# Patient Record
Sex: Female | Born: 1960 | Race: White | Hispanic: No | Marital: Married | Smoking: Never smoker
Health system: Southern US, Community
[De-identification: ages and names within clinical notes are randomized; demographics above are authoritative.]

## PROBLEM LIST (undated history)

## (undated) DIAGNOSIS — K219 Gastro-esophageal reflux disease without esophagitis: Secondary | ICD-10-CM

## (undated) DIAGNOSIS — Z973 Presence of spectacles and contact lenses: Secondary | ICD-10-CM

## (undated) DIAGNOSIS — Z8489 Family history of other specified conditions: Secondary | ICD-10-CM

## (undated) DIAGNOSIS — R9389 Abnormal findings on diagnostic imaging of other specified body structures: Secondary | ICD-10-CM

## (undated) DIAGNOSIS — N882 Stricture and stenosis of cervix uteri: Secondary | ICD-10-CM

## (undated) DIAGNOSIS — N950 Postmenopausal bleeding: Secondary | ICD-10-CM

## (undated) DIAGNOSIS — N393 Stress incontinence (female) (male): Secondary | ICD-10-CM

---

## 1993-09-26 HISTORY — PX: OTHER SURGICAL HISTORY: SHX169

## 1998-09-29 ENCOUNTER — Other Ambulatory Visit: Admission: RE | Admit: 1998-09-29 | Discharge: 1998-09-29 | Payer: Self-pay | Admitting: *Deleted

## 2000-05-26 ENCOUNTER — Other Ambulatory Visit: Admission: RE | Admit: 2000-05-26 | Discharge: 2000-05-26 | Payer: Self-pay | Admitting: *Deleted

## 2001-10-08 ENCOUNTER — Other Ambulatory Visit: Admission: RE | Admit: 2001-10-08 | Discharge: 2001-10-08 | Payer: Self-pay | Admitting: *Deleted

## 2003-05-22 ENCOUNTER — Other Ambulatory Visit: Admission: RE | Admit: 2003-05-22 | Discharge: 2003-05-22 | Payer: Self-pay | Admitting: *Deleted

## 2005-07-11 ENCOUNTER — Other Ambulatory Visit: Admission: RE | Admit: 2005-07-11 | Discharge: 2005-07-11 | Payer: Self-pay | Admitting: Obstetrics and Gynecology

## 2011-09-09 ENCOUNTER — Ambulatory Visit: Payer: BC Managed Care – PPO

## 2011-09-09 ENCOUNTER — Ambulatory Visit (INDEPENDENT_AMBULATORY_CARE_PROVIDER_SITE_OTHER): Payer: BC Managed Care – PPO

## 2011-09-09 DIAGNOSIS — Z23 Encounter for immunization: Secondary | ICD-10-CM

## 2012-08-27 ENCOUNTER — Ambulatory Visit: Payer: BC Managed Care – PPO | Admitting: Family Medicine

## 2012-08-27 VITALS — BP 114/74 | HR 75 | Temp 97.3°F | Resp 16

## 2012-08-27 DIAGNOSIS — Z23 Encounter for immunization: Secondary | ICD-10-CM

## 2013-08-15 ENCOUNTER — Ambulatory Visit (INDEPENDENT_AMBULATORY_CARE_PROVIDER_SITE_OTHER): Payer: BC Managed Care – PPO | Admitting: Physician Assistant

## 2013-08-15 VITALS — BP 120/80 | HR 103 | Temp 102.1°F | Resp 18 | Ht 67.0 in | Wt 201.0 lb

## 2013-08-15 DIAGNOSIS — R6889 Other general symptoms and signs: Secondary | ICD-10-CM

## 2013-08-15 LAB — POCT INFLUENZA A/B
Influenza A, POC: NEGATIVE
Influenza B, POC: NEGATIVE

## 2013-08-15 LAB — POCT CBC
Granulocyte percent: 87.1 %G — AB (ref 37–80)
Hemoglobin: 13.9 g/dL (ref 12.2–16.2)
MCH, POC: 27.6 pg (ref 27–31.2)
MCV: 87.1 fL (ref 80–97)
MID (cbc): 0.4 (ref 0–0.9)
Platelet Count, POC: 151 10*3/uL (ref 142–424)
RBC: 5.04 M/uL (ref 4.04–5.48)
WBC: 10.6 10*3/uL — AB (ref 4.6–10.2)

## 2013-08-15 MED ORDER — BENZONATATE 100 MG PO CAPS: 100.0000 mg | ORAL_CAPSULE | Freq: Three times a day (TID) | ORAL | Status: DC | PRN

## 2013-08-15 MED ORDER — HYDROCODONE-HOMATROPINE 5-1.5 MG/5ML PO SYRP: 5.0000 mL | ORAL_SOLUTION | Freq: Three times a day (TID) | ORAL | Status: DC | PRN

## 2013-08-15 MED ORDER — AMOXICILLIN 875 MG PO TABS: 875.0000 mg | ORAL_TABLET | Freq: Two times a day (BID) | ORAL | Status: DC

## 2013-08-15 NOTE — Patient Instructions (Signed)
Begin taking the amoxicillin as directed.  Be sure to finish the full course even when you begin feeling better.    Alternate Tylenol and Advil every 4 hours to keep the fever down.  (Next dose ibuprofen 800mg  at about 2 pm, then take 1000mg  Tylenol at about 6 pm, etc)  Use the benzonatate (Tessalon) every 8 hours as needed for cough.  You can also continue Delsym if that is helping.  Use the Hycodan syrup if needed - may make you sleepy, so be careful with the first dose.    You can continue sudafed, allergy medicine, etc  Drink plenty of fluids (water is best!) and get plenty of rest.  If any symptoms are worsening or not improving, please let us know.

## 2013-08-15 NOTE — Progress Notes (Signed)
[address]  [place], [place]  [phone]  [email or web address]  Subjective:    Patient ID: Lindsey Boyle, female    DOB: 1961-06-25, 52 y.o.   MRN: [id number]  HPI   Lindsey Boyle is a very pleasant 52 yr old female here with concern for illness.  Reports fever (tmax 102F), ear pain, sinus pressure, bad cough (non productive), SOB, body aches, fatigue.  All symptoms started 3-4 days ago.  Appetite has been down.  Feels dry.  No GI symptoms.  No flu shot this year - has not had time to get one.  Son was sick with pneumonia a couple weeks ago, saw Dr. Cleta Alberts, was sent to hospital.  Then pt's mother hospitalized with hyponatremia and UTI.  Has been with mother in the hospital all week.  Mother now at rehab.    No known sick contacts.  Has taken some Dayquil.  Has not really been treating fever   Review of Systems  Constitutional: Positive for fever, appetite change (decreased) and fatigue. Negative for chills.  HENT: Positive for congestion, ear pain and sinus pressure. Negative for sore throat.   Respiratory: Positive for cough. Negative for shortness of breath and wheezing.   Cardiovascular: Negative for chest pain.  Gastrointestinal: Negative for nausea, vomiting, abdominal pain and diarrhea.  Genitourinary: Negative for dysuria and hematuria.  Musculoskeletal: Positive for arthralgias, back pain and myalgias.  Skin: Negative for rash.  Neurological: Negative for headaches.  Hematological: Negative.   Psychiatric/Behavioral: Negative.        Objective:   Physical Exam  Vitals reviewed. Constitutional: She is oriented to person, place, and time. She appears well-developed and well-nourished. No distress.  HENT:  Head: Normocephalic and atraumatic.  Right Ear: Tympanic membrane and ear canal normal.  Left Ear: Tympanic membrane and ear canal normal.  Nose: Mucosal edema present. Right sinus exhibits maxillary sinus tenderness and frontal sinus tenderness. Left sinus exhibits maxillary  sinus tenderness and frontal sinus tenderness.  Mouth/Throat: Uvula is midline, oropharynx is clear and moist and mucous membranes are normal.  Sinuses TTP but "everywhere hurts"  Eyes: Conjunctivae are normal. No scleral icterus.  Neck: Neck supple.  Cardiovascular: Normal rate, regular rhythm and normal heart sounds.   Pulmonary/Chest: Effort normal and breath sounds normal. She has no wheezes. She has no rales.  Abdominal: Soft. There is no tenderness.  Lymphadenopathy:    She has no cervical adenopathy.  Neurological: She is alert and oriented to person, place, and time.  Skin: Skin is warm and dry.  Psychiatric: She has a normal mood and affect. Her behavior is normal.     Results for orders placed in visit on 08/15/13  POCT INFLUENZA A/B      Result Value Range   Influenza A, POC Negative     Influenza B, POC Negative    POCT CBC      Result Value Range   WBC 10.6 (*) 4.6 - 10.2 K/uL   Lymph, poc 1.0  0.6 - 3.4   POC LYMPH PERCENT 9.1 (*) 10 - 50 %L   MID (cbc) 0.4  0 - 0.9   POC MID % 3.8  0 - 12 %M   POC Granulocyte 9.2 (*) 2 - 6.9   Granulocyte percent 87.1 (*) 37 - 80 %G   RBC 5.04  4.04 - 5.48 M/uL   Hemoglobin 13.9  12.2 - 16.2 g/dL   HCT, POC 01.0  27.2 - 47.9 %   MCV 87.1  80 -  97 fL   MCH, POC 27.6  27 - 31.2 pg   MCHC 31.7 (*) 31.8 - 35.4 g/dL   RDW, POC 16.1     Platelet Count, POC 151  142 - 424 K/uL   MPV 10.7  0 - 99.8 fL    1g APAP given in clinic for fever of 102.68F Recheck temp approx 30 mins later 103.59F    Assessment & Plan:  Flu-like symptoms - Plan: POCT Influenza A/B, POCT CBC, HYDROcodone-homatropine (HYCODAN) 5-1.5 MG/5ML syrup, benzonatate (TESSALON) 100 MG capsule   Lindsey Boyle is a very pleasant 52 yr old female here with 3-4 days of flu-like symptoms including fever, body aches, non-productive cough.  Clinically, this looks like influenza, but rapid flu is negative today.  CBC with slight leukocytosis and shift.  Will cover her with  amoxicillin for upper respiratory infection.  Tamiflu likely of no benefit at this point.  Will treat symptoms with Tessalon and Hycodan.  May continue sudafed and allergy medicine for symptom relief.  Push fluids, rest.  Work note provided.  Discussed RTC precautions including worsening fever, worsening cough, SOB, etc.  Pt understands and agrees with this plan.   Meds ordered this encounter  Medications  . HYDROcodone-homatropine (HYCODAN) 5-1.5 MG/5ML syrup    Sig: Take 5 mLs by mouth every 8 (eight) hours as needed for cough.    Dispense:  30 mL    Refill:  0    Order Specific Question:  Supervising Provider    Answer:  Ethelda Chick [[id number]]  . benzonatate (TESSALON) 100 MG capsule    Sig: Take 1-2 capsules (100-200 mg total) by mouth 3 (three) times daily as needed for cough.    Dispense:  40 capsule    Refill:  0    Order Specific Question:  Supervising Provider    Answer:  Ethelda Chick [[id number]]  . amoxicillin (AMOXIL) 875 MG tablet    Sig: Take 1 tablet (875 mg total) by mouth 2 (two) times daily.    Dispense:  20 tablet    Refill:  0    Order Specific Question:  Supervising Provider    Answer:  Ethelda Chick [[id number]]    Loleta Dicker MHS, PA-C [facility] 11/20/20143:14 PM

## 2013-08-18 ENCOUNTER — Ambulatory Visit: Payer: BC Managed Care – PPO

## 2013-08-18 ENCOUNTER — Ambulatory Visit (INDEPENDENT_AMBULATORY_CARE_PROVIDER_SITE_OTHER): Payer: BC Managed Care – PPO | Admitting: Emergency Medicine

## 2013-08-18 VITALS — BP 126/78 | HR 86 | Temp 98.9°F | Resp 17 | Ht 67.0 in | Wt 208.0 lb

## 2013-08-18 DIAGNOSIS — R059 Cough, unspecified: Secondary | ICD-10-CM

## 2013-08-18 DIAGNOSIS — R05 Cough: Secondary | ICD-10-CM

## 2013-08-18 DIAGNOSIS — J189 Pneumonia, unspecified organism: Secondary | ICD-10-CM

## 2013-08-18 MED ORDER — HYDROCOD POLST-CHLORPHEN POLST 10-8 MG/5ML PO LQCR: 5.0000 mL | Freq: Two times a day (BID) | ORAL | Status: DC | PRN

## 2013-08-18 MED ORDER — AZITHROMYCIN 250 MG PO TABS: ORAL_TABLET | ORAL | Status: DC

## 2013-08-18 MED ORDER — CEFTRIAXONE SODIUM 1 G IJ SOLR
1.0000 g | INTRAMUSCULAR | Status: DC
  Administered 2013-08-18: 1 g via INTRAMUSCULAR

## 2013-08-18 NOTE — Patient Instructions (Signed)

## 2013-08-18 NOTE — Progress Notes (Addendum)
[facility] [address] [place] [address] [phone]  Date:  08/18/2013   Name:  [name]   DOB:  [birth year]   MRN:  [id number]  PCP:  No primary provider on file.    Chief Complaint: Cough, Nasal Congestion, URI and Headache   History of Present Illness:  CORNISHA Boyle is a 52 y.o. very pleasant female patient who presents with the following:  Seen four days ago with URI symptoms. Negative flu test.  Negative CBC.   Ill all week with mucoid nasal drainage, sore throat and hoarseness.  Cough, malaise, and myalgias.  Continues to have fever since visit.  She was put on amoxicillin which has not altered her symptoms.   No wheezing or shortness of breath.  Poor appetite.  No nausea or vomiting or rash.  No improvement with over the counter medications or other home remedies. Denies other complaint or health concern today.   There are no active problems to display for this patient.   Past Medical History  Diagnosis Date  . Allergy     Past Surgical History  Procedure Laterality Date  . Cesarean section      History  Substance Use Topics  . Smoking status: Never Smoker   . Smokeless tobacco: Not on file  . Alcohol Use: Not on file    Family History  Problem Relation Age of Onset  . Cancer Mother   . Cancer Father     No Known Allergies  Medication list has been reviewed and updated.  Current Outpatient Prescriptions on File Prior to Visit  Medication Sig Dispense Refill  . amoxicillin (AMOXIL) 875 MG tablet Take 1 tablet (875 mg total) by mouth 2 (two) times daily.  20 tablet  0  . benzonatate (TESSALON) 100 MG capsule Take 1-2 capsules (100-200 mg total) by mouth 3 (three) times daily as needed for cough.  40 capsule  0  . HYDROcodone-homatropine (HYCODAN) 5-1.5 MG/5ML syrup Take 5 mLs by mouth every 8 (eight) hours as needed for cough.  30 mL  0   No current facility-administered medications on file prior to visit.    Review of  Systems:  As per HPI, otherwise negative.    Physical Examination: Filed Vitals:   08/18/13 0811  BP: 126/78  Pulse: 86  Temp: 98.9 F (37.2 C)  Resp: 17   Filed Vitals:   08/18/13 0811  Height: 5\' 7"  (1.702 m)  Weight: 208 lb (94.348 kg)   Body mass index is 32.57 kg/(m^2). Ideal Body Weight: Weight in (lb) to have BMI = 25: 159.3  GEN: WDWN, NAD, Non-toxic, A & O x 3 HEENT: Atraumatic, Normocephalic. Neck supple. No masses, No LAD. Ears and Nose: No external deformity. CV: RRR, No M/G/R. No JVD. No thrill. No extra heart sounds. PULM: CTA B, no wheezes, crackles, rhonchi. No retractions. No resp. distress. No accessory muscle use. ABD: S, NT, ND, +BS. No rebound. No HSM. EXTR: No c/c/e NEURO Normal gait.  PSYCH: Normally interactive. Conversant. Not depressed or anxious appearing.  Calm demeanor.    Assessment and Plan: Pneumonia zpak tussionex  Signed,  Phillips Odor, MD    [facility] reading (PRIMARY) by  Dr. Dareen Piano.  pneumonia.

## 2013-08-31 ENCOUNTER — Ambulatory Visit (INDEPENDENT_AMBULATORY_CARE_PROVIDER_SITE_OTHER): Payer: BC Managed Care – PPO | Admitting: Family Medicine

## 2013-08-31 ENCOUNTER — Ambulatory Visit: Payer: BC Managed Care – PPO

## 2013-08-31 VITALS — BP 110/68 | HR 82 | Temp 98.6°F | Resp 12 | Ht 66.8 in | Wt 206.0 lb

## 2013-08-31 DIAGNOSIS — J189 Pneumonia, unspecified organism: Secondary | ICD-10-CM

## 2013-08-31 DIAGNOSIS — Z23 Encounter for immunization: Secondary | ICD-10-CM

## 2013-08-31 NOTE — Progress Notes (Signed)
Subjective:  This chart was scribed for Norberto Sorenson, MD by Arlan Organ, ED Scribe. This patient was seen in room Room/bed 12 and the patient's care was started 8:50 AM.    Patient ID: Lindsey Boyle, female    DOB: 1961-08-28, 52 y.o.   MRN: [id number] Chief Complaint  Patient presents with  . Cough    improved - PNA x 2 wks      HPI HPI Comments: Lindsey Boyle is a 52 y.o. female who presents to [facility] seeking follow up today from visit on 08/18/13. Pt was initially seen by Virgilio Frees PA-C, for flu like symptoms with an associated fever, sinus pressure, and cough. At the time, pt reported her 71 yo son being ill w/ pna as well. Pt had a relatively normal CBC test and was started on amoxicillin for URI-type sxs. Pt followed with Dr. Dareen Piano 2 weeks ago in clinic, 3d later, as she didn't experience any improvement from her symptoms. A chest X-Ray was performed which showed a multi-focal pneumonitis of the right middle lobe and left upper lobe. Pt was then also started on a Zpak after being given a dose of rocephin in the clinic.   Pt now states her symptoms have mostly resolved. She is feeling dramatically better.  She reports her improvement started some time around the Thanksgiving holiday. She denies SOB, fever, or CP.  She is here for a repeat CXR to ensure the pna is gone and would like her flu shot.  Past Medical History  Diagnosis Date  . Allergy   . Pneumonia 08/19/2013    Current Outpatient Prescriptions on File Prior to Visit  Medication Sig Dispense Refill  . azithromycin (ZITHROMAX) 250 MG tablet Take 2 tabs PO x 1 dose, then 1 tab PO QD x 4 days  6 tablet  0  . benzonatate (TESSALON) 100 MG capsule Take 1-2 capsules (100-200 mg total) by mouth 3 (three) times daily as needed for cough.  40 capsule  0  . chlorpheniramine-HYDROcodone (TUSSIONEX PENNKINETIC ER) 10-8 MG/5ML LQCR Take 5 mLs by mouth every 12 (twelve) hours as needed.  60 mL  0  . HYDROcodone-homatropine (HYCODAN)  5-1.5 MG/5ML syrup Take 5 mLs by mouth every 8 (eight) hours as needed for cough.  30 mL  0   Current Facility-Administered Medications on File Prior to Visit  Medication Dose Route Frequency Provider Last Rate Last Dose  . cefTRIAXone (ROCEPHIN) injection 1 g  1 g Intramuscular Q24H Phillips Odor, MD   1 g at 08/18/13 1011    No Known Allergies   Review of Systems  Constitutional: Negative for fever, chills, diaphoresis and fatigue.  HENT: Negative for congestion, rhinorrhea and sinus pressure.   Respiratory: Positive for cough.   Cardiovascular: Negative for chest pain.  Musculoskeletal: Negative for myalgias.  Skin: Negative for rash.  Neurological: Negative for weakness.  Psychiatric/Behavioral: Negative for sleep disturbance.    BP 110/68  Pulse 82  Temp(Src) 98.6 F (37 C) (Oral)  Resp 12  Ht 5' 6.8" (1.697 m)  Wt 206 lb (93.441 kg)  BMI 32.45 kg/m2  SpO2 96%  LMP 08/14/2013   Objective:   Physical Exam  Nursing note and vitals reviewed. Constitutional: She is oriented to person, place, and time. She appears well-developed and well-nourished.  HENT:  Head: Normocephalic and atraumatic.  Eyes: EOM are normal.  Neck: Normal range of motion.  Cardiovascular: Normal rate, regular rhythm and normal heart sounds.   Pulmonary/Chest: Effort  normal and breath sounds normal.  Musculoskeletal: Normal range of motion.  Lymphadenopathy:    She has no cervical adenopathy.  Neurological: She is alert and oriented to person, place, and time.  Skin: Skin is warm and dry.  Psychiatric: She has a normal mood and affect. Her behavior is normal.   Primary X-Ray reading by Dr. Norberto Sorenson: Interval clearing of multi focal pneumonias   Assessment & Plan:  PNA (pneumonia) - Plan: DG Chest 2 View - resolved. Clearance documented  Need for prophylactic vaccination and inoculation against influenza - Plan: Flu Vaccine QUAD 36+ mos IM. Reviewed hand hygiene.  No orders of the defined  types were placed in this encounter.    I personally performed the services described in this documentation, which was scribed in my presence. The recorded information has been reviewed and considered, and addended by me as needed.  Norberto Sorenson, MD MPH

## 2014-02-23 ENCOUNTER — Ambulatory Visit (INDEPENDENT_AMBULATORY_CARE_PROVIDER_SITE_OTHER): Payer: BC Managed Care – PPO | Admitting: Physician Assistant

## 2014-02-23 DIAGNOSIS — Z23 Encounter for immunization: Secondary | ICD-10-CM

## 2014-02-23 NOTE — Patient Instructions (Signed)
Congratulations!        PRE-OPERATIVE INSTRUCTIONS        Please do the body wash as directed below  Do not smoke after midnight the night before surgery - this will increase your risk of nausea and vomiting  Do not eat or drink anything for 8 hours before surgery. Failure to do this may cause cancellation of your procedure. You may drink CLEAR beverages (that you can see through) and your Ensure up to 2 hours before the procedure.   Please DO take the following medications with a sip of water on the day of your surgery: prisolec, miralax       Please go get your blood drawn on Friday, 05/13/22.   [facility] (inside main [facility])  [address], [address]  [place] [address]  [phone]  Monday-Friday: 7:30 a.m. - 5 p.m.  Sunday: 7:30 - 10:30 a.m.   Closed on Saturdays   * Will open at 7 a.m. Sundays starting January 10, 2021    [facility]  [address]  [place] [address]  [phone]  Monday-Friday: 8 a.m. - 4:30 p.m.    [facility]  [address]  [place] [address]  [phone]  Monday-Friday: 7:30 a.m. - 4:30 p.m.   *Appointment required      Day of Surgery  On the day of your surgery please leave valuable items including cash and credit cards at home if you can. You can expect:  You will be asked to remove any of the following items:  Dentures and bridges  Hearing aids  Contact lenses  Nail polish from at least one finger nail  Wigs, hairpins, combs and barrettes  You will be asked to remove all your clothes and put on a special gown  Your nursing staff may start an intravenous line (IV) attached to a tube that will supply your body with fluids and medications during and after surgery.      Preparing for your surgery - Chlorhexidine Washes    Shower with Chlorhexidine (CHG) soap to prevent infection.    Instructions:  You should shower with CHG soap a minimum of three times before your surgery, or more often as directed by  your surgeon.  You can buy CHG soap over the counter at any drug store.    Showering several times before surgery blocks germ growth and provides the best protection when used at least 3 times in a row.    two nights before surgery  the night before surgery  the morning of the admission to surgery    How to shower with CHG soap:    1. Rinse your body with warm water.    5. Lather again before rinsing.   2. Wash your hair with regular shampoo.  Rinse your hair with water. If you are having  neck surgery, use CHG soap instead of your regular shampoo to wash your hair.  Rinse your hair with water.    6.      7.   Turn on the water and rinse CHG off your body.    Dry off with a clean towel.   3. Wet a clean sponge. Turn off the water. Apply CHG liberally.  8. Do not apply lotions or powders.       4. Firmly massage all areas: neck, arms, chest, back, abdomen, hips, groin, genitals  (external only) and   buttocks. Clean your legs and feet and between your fingers and  toes. Pay special attention to the site of your surgery and all surrounding skin. Ask for help to clean your back if you have a spinal surgery.  9. Use clean clothes and freshly laundered bed linens.     --- Repeat steps 1 - 9  each time you shower. ---     Caution: When using CHG soap, avoid contact with eyes, nose, ear canals and mouth.  Important reminders:    Do not use any other soaps or body wash when using CHG. Other soaps can block the CHG benefits.  After showering, do not apply lotion, cream, powder, deodorant, or hair conditioner.  Do not shave or remove body hair. Facial shaving is permitted. If you are having head surgery, ask your doctor whether you can shave.  CHG is safe to use on minor wounds, rashes, burns, and over staples and stiches.  Allergic reactions are rare but may occur. If you have an allergic reaction, stop using CHG and call your doctor if you have a skin irritation.  If you are allergic to CHG, please follow the bathing  instructions above using an over-the-counter regular soap instead of CHG.

## 2014-02-23 NOTE — Progress Notes (Signed)
Presents for Tdap vaccination.  First grandchild is due this summer.  1. Need for Tdap vaccination - Tdap vaccine greater than or equal to 52yo IM   Fernande Bras, PA-C Physician Assistant-Certified [facility]

## 2014-07-15 ENCOUNTER — Ambulatory Visit (INDEPENDENT_AMBULATORY_CARE_PROVIDER_SITE_OTHER): Payer: BC Managed Care – PPO

## 2014-07-15 DIAGNOSIS — Z23 Encounter for immunization: Secondary | ICD-10-CM

## 2014-08-28 LAB — HM MAMMOGRAPHY

## 2014-10-02 NOTE — H&P (Signed)
  Patient name  Isa RankinBlack, Cinthia DICTATION#  [id number] CSN# [id number]  Juluis MireMCCOMB,Edu On S, MD 10/02/2014 2:39 PM

## 2014-10-03 ENCOUNTER — Encounter (HOSPITAL_BASED_OUTPATIENT_CLINIC_OR_DEPARTMENT_OTHER): Payer: Self-pay | Admitting: *Deleted

## 2014-10-03 NOTE — Progress Notes (Signed)
NPO AFTER MN. ARRIVE AT 0600. NEEDS CBC. 

## 2014-10-03 NOTE — H&P (Signed)
NAMIsa Rankin:  Varon, Christalyn                 ACCOUNT NO.:  [id number]  MEDICAL RECORD NO.:  [id number]  LOCATION:                                FACILITY:  [facility]  PHYSICIAN:  Juluis MireJohn S. Raunak Antuna, M.D.   DATE OF BIRTH:  [birth year]  DATE OF ADMISSION: DATE OF DISCHARGE:                             HISTORY & PHYSICAL   DATE OF SURGERY:  [facility] [address].  HISTORY OF PRESENT ILLNESS:  The patient is a 54 year old, gravida 2, para 2, perimenopausal patient, has been having marked menstrual irregularities.  She came in for saline infusion ultrasound.  Did have a markedly thickened endometrium; however, we could not dilate the cervix even with the os binder to get a uterine specimen.  In view of this, she now presents for hysteroscopic evaluation along with dilation and curettage.  ALLERGIES:  Allergic to pollen and dust.  MEDICATIONS:  Including only supplement.  PAST SURGICAL HISTORY:  She had 3 cesarean sections.  No significant medical illnesses.  SOCIAL HISTORY:  No tobacco, and only occasional alcohol use.  FAMILY HISTORY:  Noncontributory.  REVIEW OF SYSTEMS:  Noncontributory.  PHYSICAL EXAMINATION:  VITAL SIGNS:  The patient is afebrile, stable vital signs. HEENT:  The patient is normocephalic.  Pupils equal, round, reactive to light and accommodation.  Extraocular movements are intact.  Sclerae and conjunctivae are clear.  Oropharynx clear. NECK:  Without thyromegaly. BREASTS:  Not examined. LUNGS:  Clear. CARDIAC:  Regular rhythm rate.  No murmurs or gallops. ABDOMEN:  Benign.  No mass, organomegaly, or tenderness. PELVIC:  Normal external genitalia.  Vaginal mucosa is clear.  Cervix unremarkable.  Uterus normal size, shape, and contour.  Adnexa free of mass or tenderness. EXTREMITIES:  Trace edema. NEUROLOGIC:  Grossly within normal limits.  IMPRESSION:  Perimenopausal abnormal bleeding with thickened endometrium and  stenotic cervix.  PLAN:  The patient is to undergo cervical dilatation with curettage.  We will also proceed with hysteroscopy with resection.  The nature of procedure have been discussed along with the indications.  The risks explained include the risk of infection.  Risk of hemorrhage that could require transfusion with the risk of AIDS or hepatitis.  Excessive bleeding could require hysterectomy.  There is a risk of perforation with injury to adjacent organs, it is about that could require laparoscopy and possible exploratory surgery.  Risk of deep venous thrombosis and pulmonary embolus.  The patient expressed understanding of the potential risks and complications.     Juluis MireJohn S. Wayden Schwertner, M.D.     JSM/MEDQ  D:  10/02/2014  T:  10/02/2014  Job:  [id number]

## 2014-10-07 ENCOUNTER — Ambulatory Visit (HOSPITAL_BASED_OUTPATIENT_CLINIC_OR_DEPARTMENT_OTHER)
Admission: RE | Admit: 2014-10-07 | Discharge: 2014-10-07 | Disposition: A | Discharge status: Home / Self Care | Payer: BLUE CROSS/BLUE SHIELD | Source: Ambulatory Visit | Attending: Obstetrics and Gynecology | Admitting: Obstetrics and Gynecology

## 2014-10-07 ENCOUNTER — Ambulatory Visit (HOSPITAL_BASED_OUTPATIENT_CLINIC_OR_DEPARTMENT_OTHER): Payer: BLUE CROSS/BLUE SHIELD | Admitting: Anesthesiology

## 2014-10-07 ENCOUNTER — Encounter (HOSPITAL_BASED_OUTPATIENT_CLINIC_OR_DEPARTMENT_OTHER): Payer: Self-pay | Admitting: *Deleted

## 2014-10-07 ENCOUNTER — Encounter (HOSPITAL_BASED_OUTPATIENT_CLINIC_OR_DEPARTMENT_OTHER)
Admission: RE | Disposition: A | Discharge status: Home / Self Care | Payer: Self-pay | Source: Ambulatory Visit | Attending: Obstetrics and Gynecology

## 2014-10-07 DIAGNOSIS — R938 Abnormal findings on diagnostic imaging of other specified body structures: Secondary | ICD-10-CM | POA: Insufficient documentation

## 2014-10-07 DIAGNOSIS — N882 Stricture and stenosis of cervix uteri: Secondary | ICD-10-CM | POA: Diagnosis not present

## 2014-10-07 DIAGNOSIS — N950 Postmenopausal bleeding: Secondary | ICD-10-CM

## 2014-10-07 DIAGNOSIS — K219 Gastro-esophageal reflux disease without esophagitis: Secondary | ICD-10-CM | POA: Diagnosis not present

## 2014-10-07 DIAGNOSIS — N924 Excessive bleeding in the premenopausal period: Secondary | ICD-10-CM | POA: Diagnosis not present

## 2014-10-07 DIAGNOSIS — Z6835 Body mass index (BMI) 35.0-35.9, adult: Secondary | ICD-10-CM | POA: Insufficient documentation

## 2014-10-07 HISTORY — DX: Stress incontinence (female) (male): N39.3

## 2014-10-07 HISTORY — DX: Stricture and stenosis of cervix uteri: N88.2

## 2014-10-07 HISTORY — DX: Postmenopausal bleeding: N95.0

## 2014-10-07 HISTORY — DX: Abnormal findings on diagnostic imaging of other specified body structures: R93.89

## 2014-10-07 HISTORY — PX: DILATATION & CURRETTAGE/HYSTEROSCOPY WITH RESECTOCOPE: SHX5572

## 2014-10-07 HISTORY — DX: Presence of spectacles and contact lenses: Z97.3

## 2014-10-07 HISTORY — DX: Family history of other specified conditions: Z84.89

## 2014-10-07 HISTORY — DX: Gastro-esophageal reflux disease without esophagitis: K21.9

## 2014-10-07 LAB — CBC
HCT: 43.1 % (ref 36.0–46.0)
HEMOGLOBIN: 14.2 g/dL (ref 12.0–15.0)
MCH: 27.7 pg (ref 26.0–34.0)
MCHC: 32.9 g/dL (ref 30.0–36.0)
MCV: 84.2 fL (ref 78.0–100.0)
Platelets: 214 10*3/uL (ref 150–400)
RBC: 5.12 MIL/uL — AB (ref 3.87–5.11)
RDW: 13.7 % (ref 11.5–15.5)
WBC: 6.4 10*3/uL (ref 4.0–10.5)

## 2014-10-07 SURGERY — DILATATION & CURETTAGE/HYSTEROSCOPY WITH RESECTOCOPE
Anesthesia: General | Site: Vagina

## 2014-10-07 MED ORDER — FENTANYL CITRATE 0.05 MG/ML IJ SOLN
INTRAMUSCULAR | Status: DC | PRN
  Administered 2014-10-07 (×2): 25 ug via INTRAVENOUS

## 2014-10-07 MED ORDER — GLYCINE 1.5 % IR SOLN
Status: DC | PRN
  Administered 2014-10-07: 3000 mL

## 2014-10-07 MED ORDER — OXYCODONE-ACETAMINOPHEN 5-325 MG PO TABS
1.0000 | ORAL_TABLET | ORAL | Status: AC | PRN
  Administered 2014-10-07: 1 via ORAL
  Filled 2014-10-07: qty 1

## 2014-10-07 MED ORDER — OXYCODONE-ACETAMINOPHEN 5-325 MG PO TABS
ORAL_TABLET | ORAL | Status: AC
  Filled 2014-10-07: qty 1

## 2014-10-07 MED ORDER — LACTATED RINGERS IV SOLN
INTRAVENOUS | Status: DC
  Administered 2014-10-07: 07:00:00 via INTRAVENOUS
  Filled 2014-10-07: qty 1000

## 2014-10-07 MED ORDER — PROPOFOL 10 MG/ML IV BOLUS
INTRAVENOUS | Status: DC | PRN
  Administered 2014-10-07: 180 mg via INTRAVENOUS

## 2014-10-07 MED ORDER — CEFAZOLIN SODIUM-DEXTROSE 2-3 GM-% IV SOLR
INTRAVENOUS | Status: AC
  Filled 2014-10-07: qty 50

## 2014-10-07 MED ORDER — OXYCODONE HCL 5 MG PO TABS
5.0000 mg | ORAL_TABLET | Freq: Once | ORAL | Status: DC | PRN
  Filled 2014-10-07: qty 1

## 2014-10-07 MED ORDER — MIDAZOLAM HCL 2 MG/2ML IJ SOLN
INTRAMUSCULAR | Status: AC
  Filled 2014-10-07: qty 2

## 2014-10-07 MED ORDER — FENTANYL CITRATE 0.05 MG/ML IJ SOLN
INTRAMUSCULAR | Status: AC
Start: 2014-10-07 — End: 2014-10-07
  Filled 2014-10-07: qty 4

## 2014-10-07 MED ORDER — HYDROMORPHONE HCL 1 MG/ML IJ SOLN
0.2500 mg | INTRAMUSCULAR | Status: DC | PRN
  Filled 2014-10-07: qty 1

## 2014-10-07 MED ORDER — MIDAZOLAM HCL 5 MG/5ML IJ SOLN
INTRAMUSCULAR | Status: DC | PRN
  Administered 2014-10-07: 2 mg via INTRAVENOUS

## 2014-10-07 MED ORDER — PROMETHAZINE HCL 25 MG/ML IJ SOLN
6.2500 mg | INTRAMUSCULAR | Status: DC | PRN
Start: 2014-10-07 — End: 2014-10-07
  Filled 2014-10-07: qty 1

## 2014-10-07 MED ORDER — LIDOCAINE-EPINEPHRINE 1 %-1:100000 IJ SOLN
INTRAMUSCULAR | Status: DC | PRN
  Administered 2014-10-07: 15 mL

## 2014-10-07 MED ORDER — OXYCODONE HCL 5 MG/5ML PO SOLN
5.0000 mg | Freq: Once | ORAL | Status: DC | PRN
  Filled 2014-10-07: qty 5

## 2014-10-07 MED ORDER — ONDANSETRON HCL 4 MG/2ML IJ SOLN
INTRAMUSCULAR | Status: DC | PRN
  Administered 2014-10-07: 4 mg via INTRAVENOUS

## 2014-10-07 MED ORDER — CEFAZOLIN SODIUM-DEXTROSE 2-3 GM-% IV SOLR
2.0000 g | INTRAVENOUS | Status: AC
  Administered 2014-10-07: 2 g via INTRAVENOUS
  Filled 2014-10-07: qty 50

## 2014-10-07 SURGICAL SUPPLY — 32 items
AQUILEX ×2 IMPLANT
CANISTER SUCTION 2500CC (MISCELLANEOUS) ×3 IMPLANT
CATH ROBINSON RED A/P 16FR (CATHETERS) ×2 IMPLANT
CORD ACTIVE DISPOSABLE (ELECTRODE) ×2
CORD ELECTRO ACTIVE DISP (ELECTRODE) IMPLANT
COVER TABLE BACK 60X90 (DRAPES) ×3 IMPLANT
DRAPE CAMERA CLOSED 9X96 (DRAPES) ×3 IMPLANT
DRAPE LG THREE QUARTER DISP (DRAPES) ×3 IMPLANT
DRSG TELFA 3X8 NADH (GAUZE/BANDAGES/DRESSINGS) ×3 IMPLANT
ELECT LOOP GYNE PRO 24FR (CUTTING LOOP) ×3
ELECT REM PT RETURN 9FT ADLT (ELECTROSURGICAL) ×3
ELECT VAPORTRODE GRVD BAR (ELECTRODE) IMPLANT
ELECTRODE LOOP GYNE PRO 24FR (CUTTING LOOP) ×1 IMPLANT
ELECTRODE REM PT RTRN 9FT ADLT (ELECTROSURGICAL) ×1 IMPLANT
GLOVE BIO SURGEON STRL SZ7 (GLOVE) ×8 IMPLANT
GLOVE BIOGEL PI IND STRL 7.5 (GLOVE) IMPLANT
GLOVE BIOGEL PI INDICATOR 7.5 (GLOVE) ×4
GOWN PREVENTION PLUS LG XLONG (DISPOSABLE) ×2 IMPLANT
GOWN STRL REUS W/TWL LRG LVL3 (GOWN DISPOSABLE) ×2 IMPLANT
GOWN STRL REUS W/TWL XL LVL3 (GOWN DISPOSABLE) ×2 IMPLANT
LEGGING LITHOTOMY PAIR STRL (DRAPES) ×3 IMPLANT
NDL SPNL 22GX5 LNG QUINC BK (NEEDLE) IMPLANT
NEEDLE SPNL 22GX5 LNG QUINC BK (NEEDLE) ×3 IMPLANT
PACK BASIN DAY SURGERY FS (CUSTOM PROCEDURE TRAY) ×3 IMPLANT
PAD DRESSING TELFA 3X8 NADH (GAUZE/BANDAGES/DRESSINGS) IMPLANT
PAD OB MATERNITY 4.3X12.25 (PERSONAL CARE ITEMS) ×3 IMPLANT
PAD PREP 24X48 CUFFED NSTRL (MISCELLANEOUS) ×3 IMPLANT
SYR CONTROL 10ML LL (SYRINGE) ×2 IMPLANT
TOWEL OR 17X24 6PK STRL BLUE (TOWEL DISPOSABLE) ×6 IMPLANT
TRAY DSU PREP LF (CUSTOM PROCEDURE TRAY) ×3 IMPLANT
TUBE HYSTEROSCOPY W Y-CONNECT (TUBING) ×3 IMPLANT
WATER STERILE IRR 500ML POUR (IV SOLUTION) ×3 IMPLANT

## 2014-10-07 NOTE — Anesthesia Preprocedure Evaluation (Addendum)
Anesthesia Evaluation  Patient identified by MRN, date of birth, ID band Patient awake    Reviewed: Allergy & Precautions, NPO status , Patient's Chart, lab work & pertinent test results  Airway Mallampati: III  TM Distance: >3 FB Neck ROM: Full    Dental  (+) Teeth Intact   Pulmonary neg pulmonary ROS,          Cardiovascular negative cardio ROS      Neuro/Psych negative neurological ROS  negative psych ROS   GI/Hepatic Neg liver ROS, GERD-  ,  Endo/Other  Morbid obesity  Renal/GU negative Renal ROS     Musculoskeletal negative musculoskeletal ROS (+)   Abdominal   Peds  Hematology negative hematology ROS (+)   Anesthesia Other Findings   Reproductive/Obstetrics                            Anesthesia Physical Anesthesia Plan  ASA: II  Anesthesia Plan: General   Post-op Pain Management:    Induction: Intravenous  Airway Management Planned: LMA  Additional Equipment:   Intra-op Plan:   Post-operative Plan: Extubation in OR  Informed Consent: I have reviewed the patients History and Physical, chart, labs and discussed the procedure including the risks, benefits and alternatives for the proposed anesthesia with the patient or authorized representative who has indicated his/her understanding and acceptance.     Plan Discussed with: CRNA  Anesthesia Plan Comments:         Anesthesia Quick Evaluation

## 2014-10-07 NOTE — Op Note (Signed)
Patient name  Lindsey RankinBlack, Lindsey Boyle DICTATION#  [id number] CSN# [id number]  [facility] Center[facility]CCOM[facility],Bana Borgmeyer S, MD 10/07/2014 7:55 AM

## 2014-10-07 NOTE — H&P (Signed)
  History and physical exam unchanged 

## 2014-10-07 NOTE — Anesthesia Postprocedure Evaluation (Signed)
  Anesthesia Post-op Note  Patient: Lindsey Boyle  Procedure(s) Performed: Procedure(s): DILATATION & CURETTAGE/HYSTEROSCOPY WITH RESECTOCOPE (N/A)  Patient Location: PACU  Anesthesia Type:General  Level of Consciousness: awake and alert   Airway and Oxygen Therapy: Patient Spontanous Breathing  Post-op Pain: none  Post-op Assessment: Post-op Vital signs reviewed  Post-op Vital Signs: Reviewed  Last Vitals:  Filed Vitals:   10/07/14 0809  BP: 113/67  Pulse:   Temp: 36.5 C  Resp: 12    Complications: No apparent anesthesia complications

## 2014-10-07 NOTE — Discharge Instructions (Signed)

## 2014-10-07 NOTE — Transfer of Care (Signed)
Immediate Anesthesia Transfer of Care Note  Patient: Lindsey Boyle  Procedure(s) Performed: Procedure(s) (LRB): DILATATION & CURETTAGE/HYSTEROSCOPY WITH RESECTOCOPE (N/A)  Patient Location: PACU  Anesthesia Type: General  Level of Consciousness: sedated, patient cooperative and responds to stimulation  Airway & Oxygen Therapy: Patient Spontanous Breathing and Patient connected to face mask oxgen  Post-op Assessment: Report given to PACU RN and Post -op Vital signs reviewed and stable  Post vital signs: Reviewed and stable  Complications: No apparent anesthesia complications

## 2014-10-08 ENCOUNTER — Encounter (HOSPITAL_BASED_OUTPATIENT_CLINIC_OR_DEPARTMENT_OTHER): Payer: Self-pay | Admitting: Obstetrics and Gynecology

## 2014-10-08 NOTE — Op Note (Signed)
NAMIsa Lindsey Boyle:  [place], [place]                 ACCOUNT NO.:  [id number]  MEDICAL RECORD NO.:  [id number]  LOCATION:                                 FACILITY:  PHYSICIAN:  Juluis MireJohn S. Charlann Wayne, M.D.        DATE OF BIRTH:  DATE OF PROCEDURE:  10/07/2014 DATE OF DISCHARGE:                              OPERATIVE REPORT   PREOPERATIVE DIAGNOSIS:  Postmenopausal endometrial thickening in extremely stenotic cervix.  POSTOPERATIVE DIAGNOSIS:  Postmenopausal endometrial thickening in extremely stenotic cervix.  OPERATIVE PROCEDURE:  Paracervical block, cervical dilatation, hysteroscopy with resection of endometrium along with curettings.  SURGEON:  Juluis MireJohn S. Yacqub Baston, M.D.  ANESTHESIA:  General along with paracervical block.  BLOOD LOSS:  Minimal.  PACKS AND DRAINS:  None.  INTRAOPERATIVE BLOOD PLACED:  None.  COMPLICATIONS:  None.  INDICATION:  Dictated in history and physical.  PROCEDURE IN DETAIL:  The patient was taken to the OR, placed in supine position.  After satisfactory level of general anesthesia was obtained, she was placed in the dorsal lithotomy position using the Allen stirrups.  The patient was then draped in a sterile field.  Perineum and vagina were prepped out with Betadine.  The patient was draped in a sterile field.  Speculum was placed in the vaginal vault.  Cervix was extremely anterior.  With some manipulation, we were eventually able to grasp the anterior lip of the cervix.  We instituted paracervical block with 1% Xylocaine with epinephrine.  We used approximately 15 mL. Uterus then sounded to approximately 9 cm.  Cervix was serially dilated to size 31 Pratt dilator.  Operative hysteroscope was introduced. Intrauterine cavity was distended using glycine.  Visualization revealed some thickening of the endometrium, but no polyps or obvious abnormalities.  Multiple endometrial biopsies were obtained and sent for pathological review.  Total deficit was 100 mL.  There was no  signs of perforation or active bleeding at this point.  Endometrial curettings were then obtained. Single-tooth tenaculum was removed.  The patient taken out of the dorsal lithotomy position.  Once alert and extubated, transferred to recovery room in good condition.  Sponge, instrument, and needle count was reported correct by circulating nurse x2.     Juluis MireJohn S. Patrich Heinze, M.D.     JSM/MEDQ  D:  10/07/2014  T:  10/07/2014  Job:  [id number]

## 2014-10-31 ENCOUNTER — Encounter: Payer: Self-pay | Admitting: Family Medicine

## 2014-10-31 ENCOUNTER — Ambulatory Visit (INDEPENDENT_AMBULATORY_CARE_PROVIDER_SITE_OTHER): Payer: BLUE CROSS/BLUE SHIELD | Admitting: Family Medicine

## 2014-10-31 ENCOUNTER — Telehealth: Payer: Self-pay | Admitting: Family Medicine

## 2014-10-31 VITALS — BP 122/70 | HR 69 | Temp 98.1°F | Resp 16 | Ht 66.5 in | Wt 218.0 lb

## 2014-10-31 DIAGNOSIS — E785 Hyperlipidemia, unspecified: Secondary | ICD-10-CM

## 2014-10-31 NOTE — Progress Notes (Signed)
Subjective:    Patient ID: Lindsey Boyle, female    DOB: Jan 09, 1961, 54 y.o.   MRN: [id number] This chart was scribed for Lindsey Sorenson, MD by Jolene Provost, Medical Scribe. This patient was seen in Room 25 and the patient's care was started a 11:02 AM.   Chief Complaint  Patient presents with  . Establish Care    establish primary care -  qustions about vitamin d  . Epistaxis    4 nose bleeds since Sunday.      HPI Past Medical History  Diagnosis Date  . PMB (postmenopausal bleeding)   . Thickened endometrium   . Stenosis of cervix   . GERD (gastroesophageal reflux disease)   . SUI (stress urinary incontinence, female)   . Wears glasses   . Family history of adverse reaction to anesthesia     PONV-- PT'S CHILDREN   No Known Allergies Current Outpatient Prescriptions on File Prior to Visit  Medication Sig Dispense Refill  . acetaminophen (TYLENOL) 500 MG tablet Take 500 mg by mouth every 6 (six) hours as needed.    Marland Kitchen aspirin-acetaminophen-caffeine (EXCEDRIN MIGRAINE) 250-250-65 MG per tablet Take 1 tablet by mouth every 6 (six) hours as needed for headache.    . calcium carbonate (TUMS - DOSED IN MG ELEMENTAL CALCIUM) 500 MG chewable tablet Chew 1 tablet by mouth as needed for indigestion or heartburn.    . Multiple Vitamin (MULTIVITAMIN) tablet Take 1 tablet by mouth daily.    . Cholecalciferol (VITAMIN D3) 50000 UNITS CAPS Take 1 capsule by mouth daily.     No current facility-administered medications on file prior to visit.    HPI Comments: [name] is a 54 y.o. female with a hx of postmenopausal bleeding who presents to [facility] complaining of a left-sided nose bleed, two events in the last four days. Pt states she ate salted almonds before she had a nose bleed both times. Pt endorses nasal congestion, intermittent sinus HA's, and frequent sneezes. Pt states she takes Claritin occasionally. Pt's Gynecologist is Dr. Dalbert Mayotte. Pt states that her cholesterol has been slightly  elevated. Pt states she takes a calcium with vitimin D. Pt does not have a humidifier at home. Pt states she takes TUMS occasionally.     Review of Systems  Constitutional: Negative for fever and chills.  HENT: Positive for nosebleeds and rhinorrhea.   Cardiovascular: Negative for chest pain.  Gastrointestinal: Negative for abdominal pain.       Objective:   Physical Exam  Constitutional: She is oriented to person, place, and time. She appears well-developed and well-nourished.  HENT:  Head: Normocephalic and atraumatic.  TMs normal bilaterally. Nasal mucosa red and swollen. Clear nasal discharge.  Eyes: Pupils are equal, round, and reactive to light.  Neck: Neck supple. No thyromegaly present.  Cardiovascular: Normal rate, regular rhythm and normal heart sounds.   No murmur heard. Pulmonary/Chest: Effort normal and breath sounds normal. No respiratory distress. She has no wheezes.  Clear to ascultation bilaterally.  Lymphadenopathy:    She has no cervical adenopathy.  Neurological: She is alert and oriented to person, place, and time.  Skin: Skin is warm and dry.  Psychiatric: She has a normal mood and affect. Her behavior is normal.  Nursing note and vitals reviewed.  BP 122/70 mmHg  Pulse 69  Temp(Src) 98.1 F (36.7 C) (Oral)  Resp 16  Ht 5' 6.5" (1.689 m)  Wt 218 lb (98.884 kg)  BMI 34.66 kg/m2  SpO2 97%  LMP 08/14/2013      Assessment & Plan:  HPL - reviewed diet and exercise, rec starting omega-3s/fish oil.  Consider adding in flax seed, niacin, and/or red yeast rice.  RTC in 6 mos for flp, vit D, cmp, etc.  Epistaxis - now resolved but to reduce likelihood of recurrence rec start cetirizine-D qd x 2-3 wks w/ freq nasal saline spray, humidifier in bedroom o/n,  Meds ordered this encounter  Medications  . Calcium Carbonate (CALTRATE 600 PO)    Sig: Take by mouth.    I personally performed the services described in this documentation, which was scribed in my  presence. The recorded information has been reviewed and considered, and addended by me as needed.  Lindsey SorensonEva Taylen Wendland, MD MPH

## 2014-10-31 NOTE — Patient Instructions (Signed)
I would recommend starting a fish oil/omega-3 supplement daily as well as focusing on low cholesterol diet and exercise. If you go to this website: LinxGolf.dkhttp://www.nhlbi.nih.gov/health/public/heart/chol/chol_tlc.htm or just google "the [employer] high cholesterol info", you can find lots of great free publications you can download on how to do this.  It is important to take an omega-3/fish oil supplement that is highest in the  AHA, DHA and EPA types of omega-3 so look for those under the active ingredients and pick the one with the most during the next time you purchase supplements. Sometimes the the store brands will actually end up being the best for the least cost. Do not pay attention to the total mg listed on the front of the bottle. You may also want to consider adding in flax seed, niacin, and/or red yeast rice to help lower your cholesterol.  Start taking a cetirizine-D once daily for several weeks.  Try nasal saline spray and consider a cool mist humidifier or topical intranasal vaseline if your nasal mucosa feels dry.  If you  Have any problems or concerns feel free to call or return to clinic. I will get a hold of you when I see your labs to address any other changes.  Please make an appoinment in 6 months for fasting blood work so we can make sure you cholesterol, vitamin D, and other tests are responding.  Nosebleed Nosebleeds can be caused by many conditions, including trauma, infections, polyps, foreign bodies, dry mucous membranes or climate, medicines, and air conditioning. Most nosebleeds occur in the front of the nose. Because of this location, most nosebleeds can be controlled by pinching the nostrils gently and continuously for at least 10 to 20 minutes. The long, continuous pressure allows enough time for the blood to clot. If pressure is released during that 10 to 20 minute time period, the process may have to be started again. The nosebleed may stop by  itself or quit with pressure, or it may need concentrated heating (cautery) or pressure from packing. HOME CARE INSTRUCTIONS   If your nose was packed, try to maintain the pack inside until your health care provider removes it. If a gauze pack was used and it starts to fall out, gently replace it or cut the end off. Do not cut if a balloon catheter was used to pack the nose. Otherwise, do not remove unless instructed.  Avoid blowing your nose for 12 hours after treatment. This could dislodge the pack or clot and start the bleeding again.  If the bleeding starts again, sit up and bend forward, gently pinching the front half of your nose continuously for 20 minutes.  If bleeding was caused by dry mucous membranes, use over-the-counter saline nasal spray or gel. This will keep the mucous membranes moist and allow them to heal. If you must use a lubricant, choose the water-soluble variety. Use it only sparingly and not within several hours of lying down.  Do not use petroleum jelly or mineral oil, as these may drip into the lungs and cause serious problems.  Maintain humidity in your home by using less air conditioning or by using a humidifier.  Do not use aspirin or medicines which make bleeding more likely. Your health care provider can give you recommendations on this.  Resume normal activities as you are able, but try to avoid straining, lifting, or bending at the waist for several days.  If the nosebleeds become recurrent and the cause is unknown,  your health care provider may suggest laboratory tests. SEEK MEDICAL CARE IF: You have a fever. SEEK IMMEDIATE MEDICAL CARE IF:   Bleeding recurs and cannot be controlled.  There is unusual bleeding from or bruising on other parts of the body.  Nosebleeds continue.  There is any worsening of the condition which originally brought you in.  You become light-headed, feel faint, become sweaty, or vomit blood. MAKE SURE YOU:   Understand  these instructions.  Will watch your condition.  Will get help right away if you are not doing well or get worse. Document Released: 06/22/2005 Document Revised: 01/27/2014 Document Reviewed: 08/13/2009 [facility] Patient Information 2015 [place], [place]. This information is not intended to replace advice given to you by your health care provider. Make sure you discuss any questions you have with your health care provider.   Allergic Rhinitis Allergic rhinitis is when the mucous membranes in the nose respond to allergens. Allergens are particles in the air that cause your body to have an allergic reaction. This causes you to release allergic antibodies. Through a chain of events, these eventually cause you to release histamine into the blood stream. Although meant to protect the body, it is this release of histamine that causes your discomfort, such as frequent sneezing, congestion, and an itchy, runny nose.  CAUSES  Seasonal allergic rhinitis (hay fever) is caused by pollen allergens that may come from grasses, trees, and weeds. Year-round allergic rhinitis (perennial allergic rhinitis) is caused by allergens such as house dust mites, pet dander, and mold spores.  SYMPTOMS   Nasal stuffiness (congestion).  Itchy, runny nose with sneezing and tearing of the eyes. DIAGNOSIS  Your health care provider can help you determine the allergen or allergens that trigger your symptoms. If you and your health care provider are unable to determine the allergen, skin or blood testing may be used. TREATMENT  Allergic rhinitis does not have a cure, but it can be controlled by:  Medicines and allergy shots (immunotherapy).  Avoiding the allergen. Hay fever may often be treated with antihistamines in pill or nasal spray forms. Antihistamines block the effects of histamine. There are over-the-counter medicines that may help with nasal congestion and swelling around the eyes. Check with your health care provider  before taking or giving this medicine.  If avoiding the allergen or the medicine prescribed do not work, there are many new medicines your health care provider can prescribe. Stronger medicine may be used if initial measures are ineffective. Desensitizing injections can be used if medicine and avoidance does not work. Desensitization is when a patient is given ongoing shots until the body becomes less sensitive to the allergen. Make sure you follow up with your health care provider if problems continue. HOME CARE INSTRUCTIONS It is not possible to completely avoid allergens, but you can reduce your symptoms by taking steps to limit your exposure to them. It helps to know exactly what you are allergic to so that you can avoid your specific triggers. SEEK MEDICAL CARE IF:   You have a fever.  You develop a cough that does not stop easily (persistent).  You have shortness of breath.  You start wheezing.  Symptoms interfere with normal daily activities. Document Released: 06/07/2001 Document Revised: 09/17/2013 Document Reviewed: 05/20/2013 [facility] Patient Information 2015 [place], [place]. This information is not intended to replace advice given to you by your health care provider. Make sure you discuss any questions you have with your health care provider. Fat and Cholesterol Control  Diet Fat and cholesterol levels in your blood and organs are influenced by your diet. High levels of fat and cholesterol may lead to diseases of the heart, small and large blood vessels, gallbladder, liver, and pancreas. CONTROLLING FAT AND CHOLESTEROL WITH DIET Although exercise and lifestyle factors are important, your diet is key. That is because certain foods are known to raise cholesterol and others to lower it. The goal is to balance foods for their effect on cholesterol and more importantly, to replace saturated and trans fat with other types of fat, such as monounsaturated fat, polyunsaturated fat, and omega-3  fatty acids. On average, a person should consume no more than 15 to 17 g of saturated fat daily. Saturated and trans fats are considered "bad" fats, and they will raise LDL cholesterol. Saturated fats are primarily found in animal products such as meats, butter, and cream. However, that does not mean you need to give up all your favorite foods. Today, there are good tasting, low-fat, low-cholesterol substitutes for most of the things you like to eat. Choose low-fat or nonfat alternatives. Choose round or loin cuts of red meat. These types of cuts are lowest in fat and cholesterol. Chicken (without the skin), fish, veal, and ground [place] breast are great choices. Eliminate fatty meats, such as hot dogs and salami. Even shellfish have little or no saturated fat. Have a 3 oz (85 g) portion when you eat lean meat, poultry, or fish. Trans fats are also called "partially hydrogenated oils." They are oils that have been scientifically manipulated so that they are solid at room temperature resulting in a longer shelf life and improved taste and texture of foods in which they are added. Trans fats are found in stick margarine, some tub margarines, cookies, crackers, and baked goods.  When baking and cooking, oils are a great substitute for butter. The monounsaturated oils are especially beneficial since it is believed they lower LDL and raise HDL. The oils you should avoid entirely are saturated tropical oils, such as coconut and palm.  Remember to eat a lot from food groups that are naturally free of saturated and trans fat, including fish, fruit, vegetables, beans, grains (barley, rice, couscous, bulgur wheat), and pasta (without cream sauces).  IDENTIFYING FOODS THAT LOWER FAT AND CHOLESTEROL  Soluble fiber may lower your cholesterol. This type of fiber is found in fruits such as apples, vegetables such as broccoli, potatoes, and carrots, legumes such as beans, peas, and lentils, and grains such as barley. Foods  fortified with plant sterols (phytosterol) may also lower cholesterol. You should eat at least 2 g per day of these foods for a cholesterol lowering effect.  Read package labels to identify low-saturated fats, trans fat free, and low-fat foods at the supermarket. Select cheeses that have only 2 to 3 g saturated fat per ounce. Use a heart-healthy tub margarine that is free of trans fats or partially hydrogenated oil. When buying baked goods (cookies, crackers), avoid partially hydrogenated oils. Breads and muffins should be made from whole grains (whole-wheat or whole oat flour, instead of "flour" or "enriched flour"). Buy non-creamy canned soups with reduced salt and no added fats.  FOOD PREPARATION TECHNIQUES  Never deep-fry. If you must fry, either stir-fry, which uses very little fat, or use non-stick cooking sprays. When possible, broil, bake, or roast meats, and steam vegetables. Instead of putting butter or margarine on vegetables, use lemon and herbs, applesauce, and cinnamon (for squash and sweet potatoes). Use nonfat yogurt, salsa, and  low-fat dressings for salads.  LOW-SATURATED FAT / LOW-FAT FOOD SUBSTITUTES Meats / Saturated Fat (g)  Avoid: Steak, marbled (3 oz/85 g) / 11 g  Choose: Steak, lean (3 oz/85 g) / 4 g  Avoid: Hamburger (3 oz/85 g) / 7 g  Choose: Hamburger, lean (3 oz/85 g) / 5 g  Avoid: Ham (3 oz/85 g) / 6 g  Choose: Ham, lean cut (3 oz/85 g) / 2.4 g  Avoid: Chicken, with skin, dark meat (3 oz/85 g) / 4 g  Choose: Chicken, skin removed, dark meat (3 oz/85 g) / 2 g  Avoid: Chicken, with skin, light meat (3 oz/85 g) / 2.5 g  Choose: Chicken, skin removed, light meat (3 oz/85 g) / 1 g Dairy / Saturated Fat (g)  Avoid: Whole milk (1 cup) / 5 g  Choose: Low-fat milk, 2% (1 cup) / 3 g  Choose: Low-fat milk, 1% (1 cup) / 1.5 g  Choose: Skim milk (1 cup) / 0.3 g  Avoid: Hard cheese (1 oz/28 g) / 6 g  Choose: Skim milk cheese (1 oz/28 g) / 2 to 3 g  Avoid:  Cottage cheese, 4% fat (1 cup) / 6.5 g  Choose: Low-fat cottage cheese, 1% fat (1 cup) / 1.5 g  Avoid: Ice cream (1 cup) / 9 g  Choose: Sherbet (1 cup) / 2.5 g  Choose: Nonfat frozen yogurt (1 cup) / 0.3 g  Choose: Frozen fruit bar / trace  Avoid: Whipped cream (1 tbs) / 3.5 g  Choose: Nondairy whipped topping (1 tbs) / 1 g Condiments / Saturated Fat (g)  Avoid: Mayonnaise (1 tbs) / 2 g  Choose: Low-fat mayonnaise (1 tbs) / 1 g  Avoid: Butter (1 tbs) / 7 g  Choose: Extra light margarine (1 tbs) / 1 g  Avoid: Coconut oil (1 tbs) / 11.8 g  Choose: Olive oil (1 tbs) / 1.8 g  Choose: Corn oil (1 tbs) / 1.7 g  Choose: Safflower oil (1 tbs) / 1.2 g  Choose: Sunflower oil (1 tbs) / 1.4 g  Choose: Soybean oil (1 tbs) / 2.4 g  Choose: Canola oil (1 tbs) / 1 g Document Released: 09/12/2005 Document Revised: 01/07/2013 Document Reviewed: 12/11/2013 [employer] Patient Information 2015 [place], [place]. This information is not intended to replace advice given to you by your health care provider. Make sure you discuss any questions you have with your health care provider.

## 2014-10-31 NOTE — Telephone Encounter (Signed)
Faxed over medical release for Dr. Arelia SneddonMcComb office to send records.

## 2014-12-03 ENCOUNTER — Encounter: Payer: Self-pay | Admitting: *Deleted

## 2015-02-20 ENCOUNTER — Encounter: Payer: Self-pay | Admitting: *Deleted

## 2015-05-08 ENCOUNTER — Telehealth: Payer: Self-pay | Admitting: *Deleted

## 2015-05-08 NOTE — Telephone Encounter (Signed)
Faxing most recent mammo report to pt's gyn's office.  Will update once received.

## 2015-05-18 NOTE — Telephone Encounter (Signed)
Re-faxed most recent mammo request to pt's gyn's office.

## 2015-05-19 NOTE — Telephone Encounter (Signed)
Phoned medical records @ [facility] & reached voicemail.  LMTC 

## 2015-05-22 NOTE — Telephone Encounter (Signed)
Received faxed mammo report dated 08/28/2014 - no mammographic evidence of malignancy.  Updated health maintenance, abstracted, and sent to PCP for review.

## 2015-06-04 ENCOUNTER — Encounter: Payer: Self-pay | Admitting: Family Medicine

## 2015-06-04 ENCOUNTER — Ambulatory Visit (INDEPENDENT_AMBULATORY_CARE_PROVIDER_SITE_OTHER): Payer: BLUE CROSS/BLUE SHIELD | Admitting: Family Medicine

## 2015-06-04 VITALS — BP 136/81 | HR 64 | Temp 98.6°F | Resp 16 | Ht 67.0 in | Wt 189.0 lb

## 2015-06-04 DIAGNOSIS — Z1329 Encounter for screening for other suspected endocrine disorder: Secondary | ICD-10-CM

## 2015-06-04 DIAGNOSIS — Z1383 Encounter for screening for respiratory disorder NEC: Secondary | ICD-10-CM | POA: Diagnosis not present

## 2015-06-04 DIAGNOSIS — Z23 Encounter for immunization: Secondary | ICD-10-CM | POA: Diagnosis not present

## 2015-06-04 DIAGNOSIS — Z136 Encounter for screening for cardiovascular disorders: Secondary | ICD-10-CM | POA: Diagnosis not present

## 2015-06-04 DIAGNOSIS — Z130 Encounter for screening for diseases of the blood and blood-forming organs and certain disorders involving the immune mechanism: Secondary | ICD-10-CM

## 2015-06-04 DIAGNOSIS — E785 Hyperlipidemia, unspecified: Secondary | ICD-10-CM | POA: Diagnosis not present

## 2015-06-04 DIAGNOSIS — Z1389 Encounter for screening for other disorder: Secondary | ICD-10-CM

## 2015-06-04 DIAGNOSIS — E663 Overweight: Secondary | ICD-10-CM | POA: Diagnosis not present

## 2015-06-04 DIAGNOSIS — Z113 Encounter for screening for infections with a predominantly sexual mode of transmission: Secondary | ICD-10-CM

## 2015-06-04 LAB — POCT URINALYSIS DIPSTICK
Bilirubin, UA: NEGATIVE
Blood, UA: NEGATIVE
GLUCOSE UA: NEGATIVE
KETONES UA: NEGATIVE
Leukocytes, UA: NEGATIVE
NITRITE UA: NEGATIVE
Protein, UA: NEGATIVE
Spec Grav, UA: 1.015
Urobilinogen, UA: 0.2
pH, UA: 5

## 2015-06-04 LAB — TSH: TSH: 3.562 u[IU]/mL (ref 0.350–4.500)

## 2015-06-04 NOTE — Progress Notes (Signed)
Subjective:    Patient ID: Lindsey Boyle, female    DOB: 1961-01-17, 54 y.o.   MRN: [id number] Chief Complaint  Patient presents with  . Follow-up  . Hyperlipidemia    HPI  Doing weight watches - signed up for weight watchers and going to meetings - wkly in initially - planning to add in exercise - going to the gym before walk now that kids are back in school. Son is a Holiday representative. - he wants to go to [employer] - already starting - [place], [employer], [employer], [employer]  Had a period in May and August - had hysteroscopy in Jan Mother is doing well at 53 and just a hip replaced  MVI, no iron, citracal w/ D, fish oil, vit E, biotin, asa 81.  All once a day - yogurt a lot.  Past Medical History  Diagnosis Date  . PMB (postmenopausal bleeding)   . Thickened endometrium   . Stenosis of cervix   . GERD (gastroesophageal reflux disease)   . SUI (stress urinary incontinence, female)   . Wears glasses   . Family history of adverse reaction to anesthesia     PONV-- PT'S CHILDREN   Current Outpatient Prescriptions on File Prior to Visit  Medication Sig Dispense Refill  . acetaminophen (TYLENOL) 500 MG tablet Take 500 mg by mouth every 6 (six) hours as needed.    Marland Kitchen aspirin-acetaminophen-caffeine (EXCEDRIN MIGRAINE) 250-250-65 MG per tablet Take 1 tablet by mouth every 6 (six) hours as needed for headache.    . Calcium Carbonate (CALTRATE 600 PO) Take by mouth.    . calcium carbonate (TUMS - DOSED IN MG ELEMENTAL CALCIUM) 500 MG chewable tablet Chew 1 tablet by mouth as needed for indigestion or heartburn.    . Multiple Vitamin (MULTIVITAMIN) tablet Take 1 tablet by mouth daily.     No current facility-administered medications on file prior to visit.   No Known Allergies   Review of Systems  Constitutional: Positive for activity change, appetite change and fatigue. Negative for fever, chills, diaphoresis and unexpected weight change.  Eyes: Negative for visual disturbance.    Respiratory: Negative for cough, chest tightness and shortness of breath.   Cardiovascular: Negative for chest pain, palpitations and leg swelling.  Genitourinary: Positive for vaginal bleeding and menstrual problem. Negative for decreased urine volume.  Musculoskeletal: Positive for arthralgias. Negative for myalgias, joint swelling and gait problem.  Neurological: Negative for syncope and headaches.  Hematological: Does not bruise/bleed easily.       Objective:  BP 136/81 mmHg  Pulse 64  Temp(Src) 98.6 F (37 C)  Resp 16  Ht 5\' 7"  (1.702 m)  Wt 189 lb (85.73 kg)  BMI 29.59 kg/m2  Physical Exam  Constitutional: She is oriented to person, place, and time. She appears well-developed and well-nourished. No distress.  HENT:  Head: Normocephalic and atraumatic.  Right Ear: Tympanic membrane, external ear and ear canal normal.  Left Ear: Tympanic membrane, external ear and ear canal normal.  Nose: Nose normal. No mucosal edema or rhinorrhea.  Mouth/Throat: Uvula is midline, oropharynx is clear and moist and mucous membranes are normal. No posterior oropharyngeal erythema.  Eyes: Conjunctivae and EOM are normal. Pupils are equal, round, and reactive to light. Right eye exhibits no discharge. Left eye exhibits no discharge. No scleral icterus.  Neck: Normal range of motion. Neck supple. No thyromegaly present.  Cardiovascular: Normal rate, regular rhythm, normal heart sounds and intact distal pulses.   Pulmonary/Chest:  Effort normal and breath sounds normal. No respiratory distress.  Abdominal: Soft. Bowel sounds are normal. There is no tenderness.  Musculoskeletal: She exhibits no edema.  Lymphadenopathy:    She has no cervical adenopathy.  Neurological: She is alert and oriented to person, place, and time. She has normal reflexes.  Skin: Skin is warm and dry. She is not diaphoretic. No erythema.  Psychiatric: She has a normal mood and affect. Her behavior is normal.           Assessment & Plan:  Jan 2016 vit D 54 at [facility]  1. Need for prophylactic vaccination and inoculation against influenza   2. Hyperlipidemia   3. Screening for cardiovascular, respiratory, and genitourinary diseases   4. Screening for thyroid disorder   5. Screening for deficiency anemia   6. Routine screening for STI (sexually transmitted infection)   7. Overweight (BMI 25.0-29.9)     Orders Placed This Encounter  Procedures  . Flu Vaccine QUAD 36+ mos IM  . Hepatitis C antibody  . HIV antibody  . CBC  . TSH  . Comprehensive metabolic panel    Order Specific Question:  Has the patient fasted?    Answer:  Yes  . Lipid panel    Order Specific Question:  Has the patient fasted?    Answer:  Yes  . POCT urinalysis dipstick    Meds ordered this encounter  Medications  . aspirin 81 MG tablet    Sig: Take 81 mg by mouth daily.  . Fish Oil OIL    Sig: by Does not apply route.  Marland Kitchen glucosamine-chondroitin 500-400 MG tablet    Sig: Take 1 tablet by mouth 3 (three) times daily.  . cetirizine (ZYRTEC) 10 MG tablet    Sig: Take by mouth daily.  Marland Kitchen BIOTIN PO    Sig: Take by mouth.    Norberto Sorenson, MD MPH  Results for orders placed or performed in visit on 06/04/15  Hepatitis C antibody  Result Value Ref Range   HCV Ab NEGATIVE NEGATIVE  HIV antibody  Result Value Ref Range   HIV 1&2 Ab, 4th Generation NONREACTIVE NONREACTIVE  CBC  Result Value Ref Range   WBC 6.6 4.0 - 10.5 K/uL   RBC 5.15 (H) 3.87 - 5.11 MIL/uL   Hemoglobin 14.6 12.0 - 15.0 g/dL   HCT 16.1 09.6 - 04.5 %   MCV 87.8 78.0 - 100.0 fL   MCH 28.3 26.0 - 34.0 pg   MCHC 32.3 30.0 - 36.0 g/dL   RDW 40.9 81.1 - 91.4 %   Platelets 215 150 - 400 K/uL   MPV 12.3 8.6 - 12.4 fL  TSH  Result Value Ref Range   TSH 3.562 0.350 - 4.500 uIU/mL  Comprehensive metabolic panel  Result Value Ref Range   Sodium 141 135 - 146 mmol/L   Potassium 4.7 3.5 - 5.3 mmol/L   Chloride 105 98 - 110 mmol/L    CO2 23 20 - 31 mmol/L   Glucose, Bld 82 65 - 99 mg/dL   BUN 17 7 - 25 mg/dL   Creat 7.82 9.56 - 2.13 mg/dL   Total Bilirubin 0.5 0.2 - 1.2 mg/dL   Alkaline Phosphatase 96 33 - 130 U/L   AST 13 10 - 35 U/L   ALT 13 6 - 29 U/L   Total Protein 6.6 6.1 - 8.1 g/dL   Albumin 4.1 3.6 - 5.1 g/dL   Calcium 9.2 8.6 - 08.6  mg/dL  Lipid panel  Result Value Ref Range   Cholesterol 208 (H) 125 - 200 mg/dL   Triglycerides 161 <096 mg/dL   HDL 49 >=04 mg/dL   Total CHOL/HDL Ratio 4.2 <=5.0 Ratio   VLDL 27 <30 mg/dL   LDL Cholesterol 540 (H) <130 mg/dL  POCT urinalysis dipstick  Result Value Ref Range   Color, UA Light Yellow    Clarity, UA Clear    Glucose, UA Negative    Bilirubin, UA Negative    Ketones, UA Negative    Spec Grav, UA 1.015    Blood, UA Negative    pH, UA 5.0    Protein, UA Negative    Urobilinogen, UA 0.2    Nitrite, UA Negative    Leukocytes, UA Negative Negative

## 2015-06-05 ENCOUNTER — Ambulatory Visit: Payer: BLUE CROSS/BLUE SHIELD | Admitting: Family Medicine

## 2015-06-05 LAB — COMPREHENSIVE METABOLIC PANEL
ALK PHOS: 96 U/L (ref 33–130)
ALT: 13 U/L (ref 6–29)
AST: 13 U/L (ref 10–35)
Albumin: 4.1 g/dL (ref 3.6–5.1)
BUN: 17 mg/dL (ref 7–25)
CALCIUM: 9.2 mg/dL (ref 8.6–10.4)
CO2: 23 mmol/L (ref 20–31)
Chloride: 105 mmol/L (ref 98–110)
Creat: 0.91 mg/dL (ref 0.50–1.05)
Glucose, Bld: 82 mg/dL (ref 65–99)
POTASSIUM: 4.7 mmol/L (ref 3.5–5.3)
Sodium: 141 mmol/L (ref 135–146)
TOTAL PROTEIN: 6.6 g/dL (ref 6.1–8.1)
Total Bilirubin: 0.5 mg/dL (ref 0.2–1.2)

## 2015-06-05 LAB — CBC
HCT: 45.2 % (ref 36.0–46.0)
HEMOGLOBIN: 14.6 g/dL (ref 12.0–15.0)
MCH: 28.3 pg (ref 26.0–34.0)
MCHC: 32.3 g/dL (ref 30.0–36.0)
MCV: 87.8 fL (ref 78.0–100.0)
MPV: 12.3 fL (ref 8.6–12.4)
Platelets: 215 10*3/uL (ref 150–400)
RBC: 5.15 MIL/uL — ABNORMAL HIGH (ref 3.87–5.11)
RDW: 14.4 % (ref 11.5–15.5)
WBC: 6.6 10*3/uL (ref 4.0–10.5)

## 2015-06-05 LAB — HEPATITIS C ANTIBODY: HCV Ab: NEGATIVE

## 2015-06-05 LAB — LIPID PANEL
CHOL/HDL RATIO: 4.2 ratio (ref <=5.0)
CHOLESTEROL: 208 mg/dL — AB (ref 125–200)
HDL: 49 mg/dL (ref >=46)
LDL Cholesterol: 132 mg/dL — ABNORMAL HIGH (ref <130)
Triglycerides: 137 mg/dL (ref <150)
VLDL: 27 mg/dL (ref <30)

## 2015-06-05 LAB — HIV ANTIBODY (ROUTINE TESTING W REFLEX): HIV 1&2 Ab, 4th Generation: NONREACTIVE

## 2015-09-29 ENCOUNTER — Ambulatory Visit (INDEPENDENT_AMBULATORY_CARE_PROVIDER_SITE_OTHER): Payer: BLUE CROSS/BLUE SHIELD | Admitting: Physician Assistant

## 2015-09-29 ENCOUNTER — Encounter: Payer: Self-pay | Admitting: Physician Assistant

## 2015-09-29 VITALS — BP 110/70 | HR 73 | Temp 98.6°F | Resp 16 | Ht 66.5 in | Wt 183.2 lb

## 2015-09-29 DIAGNOSIS — J019 Acute sinusitis, unspecified: Secondary | ICD-10-CM | POA: Diagnosis not present

## 2015-09-29 MED ORDER — AMOXICILLIN-POT CLAVULANATE 875-125 MG PO TABS
1.0000 | ORAL_TABLET | Freq: Two times a day (BID) | ORAL | Status: AC
Start: 2015-09-29 — End: 2015-10-09

## 2015-09-29 NOTE — Progress Notes (Signed)
Patient ID: Lindsey Boyle, female    DOB: [birth year], 55 y.o.   MRN: [id number]  PCP: Norberto Sorenson, MD  Subjective:   Chief Complaint  Patient presents with  . headache    symptoms x 1 week  . face and teeth hurt  . cold    HPI Presents for evaluation of possible sinusitis.  Symptoms x 1 week, considerably worse today with facial and dental pain. Nasal drainage is clear-green-yellow. Some bloody mucous. The more the talks, the more she coughs. Only productive x 1. No fever. No nausea, vomiting or diarrhea. Her 71 year old mother had bronchitis about 10 days ago.  Review of Systems As above.    Patient Active Problem List   Diagnosis Date Noted  . Postmenopausal bleeding 10/07/2014    Class: Present on Admission     Prior to Admission medications   Medication Sig Start Date End Date Taking? Authorizing Provider  acetaminophen (TYLENOL) 500 MG tablet Take 500 mg by mouth every 6 (six) hours as needed.   Yes Historical Provider, MD  aspirin 81 MG tablet Take 81 mg by mouth daily.   Yes Historical Provider, MD  aspirin-acetaminophen-caffeine (EXCEDRIN MIGRAINE) [phone] MG per tablet Take 1 tablet by mouth every 6 (six) hours as needed for headache.   Yes Historical Provider, MD  BIOTIN PO Take by mouth.   Yes Historical Provider, MD  Calcium Carbonate (CALTRATE 600 PO) Take by mouth.   Yes Historical Provider, MD  calcium carbonate (TUMS - DOSED IN MG ELEMENTAL CALCIUM) 500 MG chewable tablet Chew 1 tablet by mouth as needed for indigestion or heartburn.   Yes Historical Provider, MD  cetirizine (ZYRTEC) 10 MG tablet Take by mouth daily.   Yes Historical Provider, MD  Fish Oil OIL by Does not apply route.   Yes Historical Provider, MD  glucosamine-chondroitin 500-400 MG tablet Take 1 tablet by mouth 3 (three) times daily.   Yes Historical Provider, MD  Multiple Vitamin (MULTIVITAMIN) tablet Take 1 tablet by mouth daily.   Yes Historical Provider, MD     No Known  Allergies     Objective:  Physical Exam  Constitutional: She is oriented to person, place, and time. Vital signs are normal. She appears well-developed and well-nourished. No distress.  BP 110/70 mmHg  Pulse 73  Temp(Src) 98.6 F (37 C) (Oral)  Resp 16  Ht 5' 6.5" (1.689 m)  Wt 183 lb 3.2 oz (83.099 kg)  BMI 29.13 kg/m2  SpO2 98%   HENT:  Head: Normocephalic and atraumatic.  Right Ear: Hearing, tympanic membrane, external ear and ear canal normal.  Left Ear: Hearing, tympanic membrane, external ear and ear canal normal.  Nose: Mucosal edema and rhinorrhea present.  No foreign bodies. Right sinus exhibits maxillary sinus tenderness and frontal sinus tenderness. Left sinus exhibits maxillary sinus tenderness and frontal sinus tenderness.  Mouth/Throat: Uvula is midline, oropharynx is clear and moist and mucous membranes are normal. No uvula swelling. No oropharyngeal exudate.  Eyes: Conjunctivae and EOM are normal. Pupils are equal, round, and reactive to light. Right eye exhibits no discharge. Left eye exhibits no discharge. No scleral icterus.  Neck: Trachea normal, normal range of motion and full passive range of motion without pain. Neck supple. No thyroid mass and no thyromegaly present.  Cardiovascular: Normal rate, regular rhythm and normal heart sounds.   Pulmonary/Chest: Effort normal and breath sounds normal.  Lymphadenopathy:       Head (right side): No submandibular, no tonsillar, no  preauricular, no posterior auricular and no occipital adenopathy present.       Head (left side): No submandibular, no tonsillar, no preauricular and no occipital adenopathy present.    She has no cervical adenopathy.       Right: No supraclavicular adenopathy present.       Left: No supraclavicular adenopathy present.  Neurological: She is alert and oriented to person, place, and time. She has normal strength. No cranial nerve deficit or sensory deficit.  Skin: Skin is warm, dry and intact. No  rash noted.  Psychiatric: She has a normal mood and affect. Her speech is normal and behavior is normal.           Assessment & Plan:   1. Acute sinusitis, recurrence not specified, unspecified location Supportive care.  Anticipatory guidance.  RTC if symptoms worsen/persist. - amoxicillin-clavulanate (AUGMENTIN) 875-125 MG tablet; Take 1 tablet by mouth 2 (two) times daily.  Dispense: 20 tablet; Refill: 0   Fernande Brashelle S. Dhanvin Szeto, PA-C Physician Assistant-Certified [facility]

## 2015-09-29 NOTE — Patient Instructions (Signed)
Get plenty of rest and drink at least 64 ounces of water daily. 

## 2016-01-12 ENCOUNTER — Ambulatory Visit (INDEPENDENT_AMBULATORY_CARE_PROVIDER_SITE_OTHER): Payer: BLUE CROSS/BLUE SHIELD | Admitting: Physician Assistant

## 2016-01-12 ENCOUNTER — Encounter: Payer: Self-pay | Admitting: Physician Assistant

## 2016-01-12 VITALS — BP 124/76 | HR 65 | Temp 98.5°F | Resp 16 | Ht 66.5 in | Wt 175.0 lb

## 2016-01-12 DIAGNOSIS — L255 Unspecified contact dermatitis due to plants, except food: Secondary | ICD-10-CM | POA: Diagnosis not present

## 2016-01-12 MED ORDER — PREDNISONE 20 MG PO TABS: ORAL_TABLET | ORAL | Status: DC

## 2016-01-12 NOTE — Progress Notes (Signed)
Patient ID: Lindsey Boyle, female    DOB: May 07, 1961, 55 y.o.   MRN: [id number]  PCP: Norberto SorensonSHAW,EVA, MD  Subjective:   Chief Complaint  Patient presents with  . Poison Ivy    x 1 week    HPI Presents for evaluation of itchy lesions following yard work last week.  She worked in the yard 10 days ago, where she thought she was into some Rhus species plants. She washed off immediately following, but "missed some spots."    Review of Systems  Constitutional: Negative for fever, chills and fatigue.  HENT: Negative for congestion and sneezing.   Eyes: Negative for pain, discharge, itching and visual disturbance.  Respiratory: Negative for apnea, cough, choking, chest tightness, shortness of breath, wheezing and stridor.   Skin: Positive for rash.  Neurological: Negative for dizziness, weakness and headaches.       Patient Active Problem List   Diagnosis Date Noted  . Postmenopausal bleeding 10/07/2014    Class: Present on Admission     Prior to Admission medications   Medication Sig Start Date End Date Taking? Authorizing Provider  acetaminophen (TYLENOL) 500 MG tablet Take 500 mg by mouth every 6 (six) hours as needed.   Yes Historical Provider, MD  aspirin 81 MG tablet Take 81 mg by mouth daily.   Yes Historical Provider, MD  aspirin-acetaminophen-caffeine (EXCEDRIN MIGRAINE) [phone] MG per tablet Take 1 tablet by mouth every 6 (six) hours as needed for headache.   Yes Historical Provider, MD  BIOTIN PO Take by mouth.   Yes Historical Provider, MD  Calcium Carbonate (CALTRATE 600 PO) Take by mouth.   Yes Historical Provider, MD  calcium carbonate (TUMS - DOSED IN MG ELEMENTAL CALCIUM) 500 MG chewable tablet Chew 1 tablet by mouth as needed for indigestion or heartburn.   Yes Historical Provider, MD  cetirizine (ZYRTEC) 10 MG tablet Take by mouth daily.   Yes Historical Provider, MD  Fish Oil OIL by Does not apply route.   Yes Historical Provider, MD  glucosamine-chondroitin  500-400 MG tablet Take 1 tablet by mouth 3 (three) times daily.   Yes Historical Provider, MD  Multiple Vitamin (MULTIVITAMIN) tablet Take 1 tablet by mouth daily.   Yes Historical Provider, MD  vitamin E 100 UNIT capsule Take by mouth daily.   Yes Historical Provider, MD     No Known Allergies     Objective:  Physical Exam  Constitutional: She is oriented to person, place, and time. She appears well-developed and well-nourished. No distress.  BP 124/76 mmHg  Pulse 65  Temp(Src) 98.5 F (36.9 C)  Resp 16  Ht 5' 6.5" (1.689 m)  Wt 175 lb (79.379 kg)  BMI 27.83 kg/m2   Eyes: Conjunctivae are normal. Right eye exhibits no discharge. Left eye exhibits no discharge. No scleral icterus.  Cardiovascular: Normal rate.   Pulmonary/Chest: Effort normal.  Neurological: She is alert and oriented to person, place, and time.  Skin: Skin is warm and dry. Rash noted. Rash is maculopapular and vesicular.     Psychiatric: She has a normal mood and affect. Her behavior is normal.           Assessment & Plan:   1. Rhus dermatitis Supportive care. Anticipatory guidance. RTC if symptoms worsen/persist. - predniSONE (DELTASONE) 20 MG tablet; Take 3 PO QAM x3days, 2 PO QAM x3days, 1 PO QAM x3days  Dispense: 18 tablet; Refill: 0   Fernande Brashelle S. Fia Hebert, PA-C Physician Assistant-Certified [facility]  [facility]  

## 2016-01-12 NOTE — Patient Instructions (Signed)
     IF you received an x-ray today, you will receive an invoice from [facility]. Please contact [facility] at [phone] with questions or concerns regarding your invoice.   IF you received labwork today, you will receive an invoice from [facility]. Please contact [facility] at [phone] with questions or concerns regarding your invoice.   Our billing staff will not be able to assist you with questions regarding bills from these companies.  You will be contacted with the lab results as soon as they are available. The fastest way to get your results is to activate your My Chart account. Instructions are located on the last page of this paperwork. If you have not heard from us regarding the results in 2 weeks, please contact this office.      

## 2016-02-15 ENCOUNTER — Encounter: Payer: Self-pay | Admitting: Family Medicine

## 2016-02-24 ENCOUNTER — Telehealth: Payer: Self-pay

## 2016-02-24 NOTE — Telephone Encounter (Signed)
Patient called to check the status of her MyChart email that she sent to Dr Clelia CroftShaw on the 22nd. She stated that she has not heard anything in a week and she would like to have Dr Clelia CroftShaw email her back through [facility]. Thank you!

## 2016-02-25 NOTE — Telephone Encounter (Signed)
Would be good to have her come in to address her request for sleeping pills. If she just needs a week or two supply and has tried something prior that worked well for her than I need to know what she wants. I am not familiar with weight watches and so don't understand her request. Does she want me to write a letter stating that she has met her goal weight at 175??

## 2016-02-26 MED ORDER — ZOLPIDEM TARTRATE 5 MG PO TABS: 5.0000 mg | ORAL_TABLET | Freq: Every evening | ORAL | Status: DC | PRN

## 2016-02-26 NOTE — Telephone Encounter (Signed)
Dr. Clelia CroftShaw,  Pt states that she will wait for 1 month before the letter is written. Per patient being that she is going to [place] she is going to try and maintain her weight of actually 170 or see if she can drop to 165. Per pt she will call back in one month for the letter.

## 2016-02-26 NOTE — Telephone Encounter (Signed)
ambien printed off. Yes a healthy weight for pt can vary depending on body habitus and age but I am happy to sign a letter stating that the goal weight for this patient should be 175.  I do think that is completely reasonable (although in all actuality if she could get to 160 lbs that would be amazing but 175 is unlikely to cause any health problems so happy to sign a letter stating this if it will make her life easier.) If this works for pt, please type up and I will sign. Thanks.

## 2016-02-26 NOTE — Telephone Encounter (Signed)
Sounds good. Though [place] isn't where I would go to to loose weight but best of luck to her - at least she can walk of the gelato.

## 2016-02-26 NOTE — Telephone Encounter (Signed)
Dr. Clelia CroftShaw,  I spoke with patient and she states that she was on Ambien and Ambien time release before. Per pt she only needs medication for 2 weeks while she travels to [place]. She will leave on 03/08/16. Per pt both worked well and she is willing to come in if she needs to. Also she would like to know what is a heathy medical weight for her height of 5'6.5 where she is not consider to be obese. Please advise   Thanks   Jeraldine Primeau

## 2016-06-17 ENCOUNTER — Ambulatory Visit (INDEPENDENT_AMBULATORY_CARE_PROVIDER_SITE_OTHER): Payer: BLUE CROSS/BLUE SHIELD | Admitting: Physician Assistant

## 2016-06-17 ENCOUNTER — Ambulatory Visit (INDEPENDENT_AMBULATORY_CARE_PROVIDER_SITE_OTHER): Payer: BLUE CROSS/BLUE SHIELD

## 2016-06-17 VITALS — BP 118/78 | HR 74 | Temp 98.3°F | Resp 16 | Ht 66.0 in | Wt 176.4 lb

## 2016-06-17 DIAGNOSIS — Z130 Encounter for screening for diseases of the blood and blood-forming organs and certain disorders involving the immune mechanism: Secondary | ICD-10-CM

## 2016-06-17 DIAGNOSIS — Z13228 Encounter for screening for other metabolic disorders: Secondary | ICD-10-CM | POA: Diagnosis not present

## 2016-06-17 DIAGNOSIS — M958 Other specified acquired deformities of musculoskeletal system: Secondary | ICD-10-CM

## 2016-06-17 DIAGNOSIS — Z23 Encounter for immunization: Secondary | ICD-10-CM | POA: Diagnosis not present

## 2016-06-17 DIAGNOSIS — R748 Abnormal levels of other serum enzymes: Secondary | ICD-10-CM

## 2016-06-17 DIAGNOSIS — Z13220 Encounter for screening for lipoid disorders: Secondary | ICD-10-CM

## 2016-06-17 DIAGNOSIS — Z0000 Encounter for general adult medical examination without abnormal findings: Secondary | ICD-10-CM

## 2016-06-17 DIAGNOSIS — Z1389 Encounter for screening for other disorder: Secondary | ICD-10-CM

## 2016-06-17 DIAGNOSIS — Z139 Encounter for screening, unspecified: Secondary | ICD-10-CM

## 2016-06-17 DIAGNOSIS — R7989 Other specified abnormal findings of blood chemistry: Secondary | ICD-10-CM

## 2016-06-17 LAB — POC MICROSCOPIC URINALYSIS (UMFC): MUCUS RE: ABSENT

## 2016-06-17 LAB — COMPREHENSIVE METABOLIC PANEL
ALK PHOS: 63 U/L (ref 33–130)
ALT: 13 U/L (ref 6–29)
AST: 11 U/L (ref 10–35)
Albumin: 3.9 g/dL (ref 3.6–5.1)
BILIRUBIN TOTAL: 0.5 mg/dL (ref 0.2–1.2)
BUN: 23 mg/dL (ref 7–25)
CO2: 27 mmol/L (ref 20–31)
CREATININE: 1.07 mg/dL — AB (ref 0.50–1.05)
Calcium: 9.3 mg/dL (ref 8.6–10.4)
Chloride: 106 mmol/L (ref 98–110)
Glucose, Bld: 85 mg/dL (ref 65–99)
POTASSIUM: 5 mmol/L (ref 3.5–5.3)
SODIUM: 140 mmol/L (ref 135–146)
TOTAL PROTEIN: 6.2 g/dL (ref 6.1–8.1)

## 2016-06-17 LAB — POCT URINALYSIS DIP (MANUAL ENTRY)
Bilirubin, UA: NEGATIVE
Blood, UA: NEGATIVE
GLUCOSE UA: NEGATIVE
Ketones, POC UA: NEGATIVE
LEUKOCYTES UA: NEGATIVE
NITRITE UA: NEGATIVE
Protein Ur, POC: NEGATIVE
Spec Grav, UA: 1.015
UROBILINOGEN UA: 0.2
pH, UA: 5.5

## 2016-06-17 LAB — CBC WITH DIFFERENTIAL/PLATELET
BASOS PCT: 1 %
Basophils Absolute: 49 cells/uL (ref 0–200)
EOS ABS: 588 {cells}/uL — AB (ref 15–500)
Eosinophils Relative: 12 %
HCT: 42.3 % (ref 35.0–45.0)
Hemoglobin: 14 g/dL (ref 11.7–15.5)
Lymphocytes Relative: 34 %
Lymphs Abs: 1666 cells/uL (ref 850–3900)
MCH: 28.2 pg (ref 27.0–33.0)
MCHC: 33.1 g/dL (ref 32.0–36.0)
MCV: 85.1 fL (ref 80.0–100.0)
MONOS PCT: 5 %
MPV: 11.6 fL (ref 7.5–12.5)
Monocytes Absolute: 245 cells/uL (ref 200–950)
NEUTROS ABS: 2352 {cells}/uL (ref 1500–7800)
Neutrophils Relative %: 48 %
PLATELETS: 187 10*3/uL (ref 140–400)
RBC: 4.97 MIL/uL (ref 3.80–5.10)
RDW: 14.8 % (ref 11.0–15.0)
WBC: 4.9 10*3/uL (ref 3.8–10.8)

## 2016-06-17 LAB — LIPID PANEL
CHOLESTEROL: 194 mg/dL (ref 125–200)
HDL: 53 mg/dL (ref >=46)
LDL Cholesterol: 123 mg/dL (ref <130)
Total CHOL/HDL Ratio: 3.7 Ratio (ref <=5.0)
Triglycerides: 91 mg/dL (ref <150)
VLDL: 18 mg/dL (ref <30)

## 2016-06-17 NOTE — Progress Notes (Signed)
Patient ID: Lindsey Boyle, female    DOB: 1961/01/05, 55 y.o.   MRN: [id number]  PCP: Norberto Sorenson, MD  Chief Complaint  Patient presents with  . Annual Exam    Subjective:   HPI: Presents for [employer].  Cervical Cancer Screening: 08/18/2014, normal. Repeat 07/2017. Breast Cancer Screening: mammogram 08/28/2014. Scheduled for 09/05/2016. Colorectal Cancer Screening: 08/2013. Repeat 2024. Bone Density Testing: DEXA scheduled for 09/05/2016 HIV Screening: complete STI Screening: very low risk Seasonal Influenza Vaccination: annually Td/Tdap Vaccination: 02/23/2014. Repeat 2025 Pneumococcal Vaccination: not yet a candidate Zoster Vaccination: not yet a candidate    Patient Active Problem List   Diagnosis Date Noted  . Elevated serum creatinine 06/22/2016  . Postmenopausal bleeding 10/07/2014    Class: Present on Admission    Past Medical History:  Diagnosis Date  . Family history of adverse reaction to anesthesia    PONV-- PT'S CHILDREN  . GERD (gastroesophageal reflux disease)   . PMB (postmenopausal bleeding)   . Stenosis of cervix   . SUI (stress urinary incontinence, female)   . Thickened endometrium   . Wears glasses      Prior to Admission medications   Medication Sig Start Date End Date Taking? Authorizing Provider  aspirin 81 MG tablet Take 81 mg by mouth daily.   Yes Historical Provider, MD  aspirin-acetaminophen-caffeine (EXCEDRIN MIGRAINE) [phone] MG per tablet Take 1 tablet by mouth every 6 (six) hours as needed for headache.   Yes Historical Provider, MD  BIOTIN PO Take by mouth.   Yes Historical Provider, MD  Calcium Carbonate (CALTRATE 600 PO) Take by mouth.   Yes Historical Provider, MD  calcium carbonate (TUMS - DOSED IN MG ELEMENTAL CALCIUM) 500 MG chewable tablet Chew 1 tablet by mouth as needed for indigestion or heartburn.   Yes Historical Provider, MD  cetirizine (ZYRTEC) 10 MG tablet Take by mouth daily.   Yes Historical Provider,  MD  Fish Oil OIL by Does not apply route.   Yes Historical Provider, MD  glucosamine-chondroitin 500-400 MG tablet Take 1 tablet by mouth 3 (three) times daily.   Yes Historical Provider, MD  Multiple Vitamin (MULTIVITAMIN) tablet Take 1 tablet by mouth daily.   Yes Historical Provider, MD  vitamin E 100 UNIT capsule Take by mouth daily.   Yes Historical Provider, MD    No Known Allergies  Past Surgical History:  Procedure Laterality Date  . CESAREAN SECTION  1997  &  1998  . DILATATION & CURRETTAGE/HYSTEROSCOPY WITH RESECTOCOPE N/A 10/07/2014   Procedure: DILATATION & CURETTAGE/HYSTEROSCOPY WITH RESECTOCOPE;  Surgeon: Juluis Mire, MD;  Location: [facility];  Service: Gynecology;  Laterality: N/A;  . DX LAPAROSCOPY W/  ABLATION  ENDOMETRIOSIS  1995    Family History  Problem Relation Age of Onset  . Cancer Mother     breast  . Cancer Father     pancreas  . Cancer Maternal Grandfather     colon    Social History   Social History  . Marital status: Married    Spouse name: N/A  . Number of children: N/A  . Years of education: N/A   Occupational History  . Environmental health practitioner    Social History Main Topics  . Smoking status: Never Smoker  . Smokeless tobacco: Never Used  . Alcohol use Yes     Comment: social use  . Drug use: No  . Sexual activity: Yes   Other Topics Concern  . None  Social History Narrative   Lives with her husband.       Review of Systems  Constitutional: Negative.   HENT: Negative.   Eyes: Negative.   Respiratory: Negative.   Cardiovascular: Negative.   Gastrointestinal: Negative.   Endocrine: Negative.   Genitourinary: Negative.   Musculoskeletal: Positive for neck pain. Negative for arthralgias, back pain, gait problem, joint swelling, myalgias and neck stiffness.       Notes swelling of the proximal clavicle. Not tender. No previous history of injury.  Skin: Negative.   Allergic/Immunologic: Negative.     Neurological: Negative.   Hematological: Negative.   Psychiatric/Behavioral: Negative.         Objective:  Physical Exam  Constitutional: She is oriented to person, place, and time. Vital signs are normal. She appears well-developed and well-nourished. She is active and cooperative. No distress.  BP 118/78 (BP Location: Right Arm, Patient Position: Sitting, Cuff Size: Large)   Pulse 74   Temp 98.3 F (36.8 C) (Oral)   Resp 16   Ht 5\' 6"  (1.676 m)   Wt 176 lb 6.4 oz (80 kg)   SpO2 98%   BMI 28.47 kg/m    HENT:  Head: Normocephalic and atraumatic.  Right Ear: Hearing, tympanic membrane, external ear and ear canal normal. No foreign bodies.  Left Ear: Hearing, tympanic membrane, external ear and ear canal normal. No foreign bodies.  Nose: Nose normal.  Mouth/Throat: Uvula is midline, oropharynx is clear and moist and mucous membranes are normal. No oral lesions. Normal dentition. No dental abscesses or uvula swelling. No oropharyngeal exudate.  Eyes: Conjunctivae, EOM and lids are normal. Pupils are equal, round, and reactive to light. Right eye exhibits no discharge. Left eye exhibits no discharge. No scleral icterus.  Fundoscopic exam:      The right eye shows no arteriolar narrowing, no AV nicking, no exudate, no hemorrhage and no papilledema. The right eye shows red reflex.       The left eye shows no arteriolar narrowing, no AV nicking, no exudate, no hemorrhage and no papilledema. The left eye shows red reflex.  Neck: Trachea normal, normal range of motion and full passive range of motion without pain. Neck supple. No spinous process tenderness and no muscular tenderness present. No thyroid mass and no thyromegaly present.  Cardiovascular: Normal rate, regular rhythm, normal heart sounds, intact distal pulses and normal pulses.   Pulmonary/Chest: Effort normal and breath sounds normal.  Musculoskeletal: She exhibits no edema or tenderness.       Cervical back: Normal.        Thoracic back: Normal.       Lumbar back: Normal.       Arms: Lymphadenopathy:       Head (right side): No tonsillar, no preauricular, no posterior auricular and no occipital adenopathy present.       Head (left side): No tonsillar, no preauricular, no posterior auricular and no occipital adenopathy present.    She has no cervical adenopathy.       Right: No supraclavicular adenopathy present.       Left: No supraclavicular adenopathy present.  Neurological: She is alert and oriented to person, place, and time. She has normal strength and normal reflexes. No cranial nerve deficit. She exhibits normal muscle tone. Coordination and gait normal.  Skin: Skin is warm, dry and intact. No rash noted. She is not diaphoretic. No cyanosis or erythema. Nails show no clubbing.  Psychiatric: She has a normal mood and affect.  Her speech is normal and behavior is normal. Judgment and thought content normal.    Dg Clavicle Right  Result Date: 06/17/2016 CLINICAL DATA:  Proximal clavicle swelling/deformity EXAM: RIGHT CLAVICLE - 2+ VIEWS COMPARISON:  08/31/2013 FINDINGS: Intact right clavicle. Moderate AC joint degenerative change with bony spurring, sclerosis and joint space loss. No separation. Visualize right upper lobe clear. Trachea is midline. 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 Electronically Signed   By: Judie Petit.  Shick M.D.   On: 06/17/2016 16:01          Assessment & Plan:  1. Annual physical exam Age appropriate anticipatory guidance provided.  2. Need for prophylactic vaccination and inoculation against influenza - Flu Vaccine QUAD 36+ mos IM  3. Screening for hyperlipidemia - Lipid panel  4. Screening for deficiency anemia - CBC with Differential/Platelet  5. Screening for metabolic disorder - Comprehensive metabolic panel  6. Screening for blood or protein in urine - POCT urinalysis dipstick - POCT Microscopic  Urinalysis ([facility])  7. Deformity of clavicle Radiographs reveal no abnormality of the area of prominence, but does show degenerative changes at the [facility] joint. - DG Clavicle Right; Future   Fernande Bras, PA-C Physician Assistant-Certified [facility]

## 2016-06-17 NOTE — Progress Notes (Signed)
Subjective:    Patient ID: Lindsey Boyle, female    DOB: 1961-06-19, 55 y.o.   MRN: [id number] Chief Complaint  Patient presents with  . Annual Exam   HPI Patient presents today for an annual physical exam. Patient has no complaints and in generally in good health. Patient has gynecologist for routine gynecological exams. She reports recent lump on here clavicle. She is not sure how long it has been there.  Past Medical History:  Diagnosis Date  . Family history of adverse reaction to anesthesia    PONV-- PT'S CHILDREN  . GERD (gastroesophageal reflux disease)   . PMB (postmenopausal bleeding)   . Stenosis of cervix   . SUI (stress urinary incontinence, female)   . Thickened endometrium   . Wears glasses    Current Outpatient Prescriptions on File Prior to Visit  Medication Sig Dispense Refill  . aspirin 81 MG tablet Take 81 mg by mouth daily.    Marland Kitchen aspirin-acetaminophen-caffeine (EXCEDRIN MIGRAINE) 250-250-65 MG per tablet Take 1 tablet by mouth every 6 (six) hours as needed for headache.    Marland Kitchen BIOTIN PO Take by mouth.    . Calcium Carbonate (CALTRATE 600 PO) Take by mouth.    . calcium carbonate (TUMS - DOSED IN MG ELEMENTAL CALCIUM) 500 MG chewable tablet Chew 1 tablet by mouth as needed for indigestion or heartburn.    . cetirizine (ZYRTEC) 10 MG tablet Take by mouth daily.    . Fish Oil OIL by Does not apply route.    Marland Kitchen glucosamine-chondroitin 500-400 MG tablet Take 1 tablet by mouth 3 (three) times daily.    . Multiple Vitamin (MULTIVITAMIN) tablet Take 1 tablet by mouth daily.    . vitamin E 100 UNIT capsule Take by mouth daily.     No current facility-administered medications on file prior to visit.    Past Surgical History:  Procedure Laterality Date  . [place]  . DILATATION & CURRETTAGE/HYSTEROSCOPY WITH RESECTOCOPE N/A 10/07/2014   Procedure: DILATATION & CURETTAGE/HYSTEROSCOPY WITH RESECTOCOPE;  Surgeon: Darlyn Chamber, MD;  Location: [place];  Service: Gynecology;  Laterality: N/A;  . DX LAPAROSCOPY W/  ABLATION  ENDOMETRIOSIS  1995   Social History   Social History  . Marital status: Married    Spouse name: N/A  . Number of children: N/A  . Years of education: N/A   Occupational History  . Not on file.   Social History Main Topics  . Smoking status: Never Smoker  . Smokeless tobacco: Never Used  . Alcohol use Yes     Comment: social use  . Drug use: No  . Sexual activity: Not on file   Other Topics Concern  . Not on file   Social History Narrative  . No narrative on file    Review of Systems  Constitutional: Negative for appetite change, chills, fatigue, fever and unexpected weight change.  HENT: Negative for congestion, rhinorrhea and sore throat.   Eyes: Negative for visual disturbance.  Respiratory: Negative for chest tightness.   Cardiovascular: Negative for chest pain, palpitations and leg swelling.  Gastrointestinal: Negative for diarrhea, nausea and vomiting.  Genitourinary: Negative for dysuria, frequency and urgency.  Musculoskeletal: Positive for neck pain. Negative for arthralgias and back pain.  Skin: Negative for rash.  Neurological: Negative for dizziness, weakness and headaches.  Psychiatric/Behavioral: Negative for agitation and behavioral problems.       Objective:   Physical Exam  Constitutional: She appears well-developed and well-nourished. No distress.  HENT:  Head: Normocephalic and atraumatic.  Right Ear: External ear normal.  Left Ear: External ear normal.  Nose: Nose normal.  Mouth/Throat: No oropharyngeal exudate.  Eyes: Conjunctivae are normal. Pupils are equal, round, and reactive to light. Right eye exhibits no discharge. Left eye exhibits no discharge.  Neck: Normal range of motion. Neck supple.  Cardiovascular: Normal rate, regular rhythm, normal heart sounds and intact distal pulses.  Exam reveals no gallop and no friction rub.   No murmur  heard. Pulmonary/Chest: Breath sounds normal. No respiratory distress. She exhibits no tenderness.  Abdominal: Soft. Bowel sounds are normal. She exhibits no distension and no mass. There is no tenderness.  Musculoskeletal:       Right shoulder: She exhibits deformity.       Arms: Lymphadenopathy:    She has no cervical adenopathy.  Neurological: She is alert. She displays normal reflexes. She exhibits normal muscle tone.  Skin: Skin is warm and dry.  Psychiatric: She has a normal mood and affect. Her behavior is normal.   BP 118/78 (BP Location: Right Arm, Patient Position: Sitting, Cuff Size: Large)   Pulse 74   Temp 98.3 F (36.8 C) (Oral)   Resp 16   Ht '5\' 6"'  (1.676 m)   Wt 176 lb 6.4 oz (80 kg)   SpO2 98%   BMI 28.47 kg/m   Results for orders placed or performed in visit on 06/17/16  CBC with Differential/Platelet  Result Value Ref Range   WBC 4.9 3.8 - 10.8 K/uL   RBC 4.97 3.80 - 5.10 MIL/uL   Hemoglobin 14.0 11.7 - 15.5 g/dL   HCT 42.3 35.0 - 45.0 %   MCV 85.1 80.0 - 100.0 fL   MCH 28.2 27.0 - 33.0 pg   MCHC 33.1 32.0 - 36.0 g/dL   RDW 14.8 11.0 - 15.0 %   Platelets 187 140 - 400 K/uL   MPV 11.6 7.5 - 12.5 fL   Neutro Abs 2,352 1,500 - 7,800 cells/uL   Lymphs Abs 1,666 850 - 3,900 cells/uL   Monocytes Absolute 245 200 - 950 cells/uL   Eosinophils Absolute 588 (H) 15 - 500 cells/uL   Basophils Absolute 49 0 - 200 cells/uL   Neutrophils Relative % 48 %   Lymphocytes Relative 34 %   Monocytes Relative 5 %   Eosinophils Relative 12 %   Basophils Relative 1 %   Smear Review Criteria for review not met   Comprehensive metabolic panel  Result Value Ref Range   Sodium 140 135 - 146 mmol/L   Potassium 5.0 3.5 - 5.3 mmol/L   Chloride 106 98 - 110 mmol/L   CO2 27 20 - 31 mmol/L   Glucose, Bld 85 65 - 99 mg/dL   BUN 23 7 - 25 mg/dL   Creat 1.07 (H) 0.50 - 1.05 mg/dL   Total Bilirubin 0.5 0.2 - 1.2 mg/dL   Alkaline Phosphatase 63 33 - 130 U/L   AST 11 10 - 35 U/L    ALT 13 6 - 29 U/L   Total Protein 6.2 6.1 - 8.1 g/dL   Albumin 3.9 3.6 - 5.1 g/dL   Calcium 9.3 8.6 - 10.4 mg/dL  Lipid panel  Result Value Ref Range   Cholesterol 194 125 - 200 mg/dL   Triglycerides 91 <150 mg/dL   HDL 53 >=46 mg/dL   Total CHOL/HDL Ratio 3.7 <=5.0 Ratio   VLDL 18 <30 mg/dL  LDL Cholesterol 123 <130 mg/dL  POCT urinalysis dipstick  Result Value Ref Range   Color, UA yellow yellow   Clarity, UA clear clear   Glucose, UA negative negative   Bilirubin, UA negative negative   Ketones, POC UA negative negative   Spec Grav, UA 1.015    Blood, UA negative negative   pH, UA 5.5    Protein Ur, POC negative negative   Urobilinogen, UA 0.2    Nitrite, UA Negative Negative   Leukocytes, UA Negative Negative  POCT Microscopic Urinalysis ([facility])  Result Value Ref Range   WBC,UR,HPF,POC None None WBC/hpf   RBC,UR,HPF,POC None None RBC/hpf   Bacteria None None, Too numerous to count   Mucus Absent Absent   Epithelial Cells, UR Per Microscopy None None, Too numerous to count cells/hpf        Assessment & Plan:  1. Annual physical exam  2. Need for prophylactic vaccination and inoculation against influenza - Flu Vaccine QUAD 36+ mos IM  3. Screening for hyperlipidemia Routine screening, await results for further guidance  - Lipid panel  4. Screening for deficiency anemia Await labs for further guidence - CBC with Differential/Platelet  5. Screening for metabolic disorder Await labs for further guidence - Comprehensive metabolic panel  6. Screening for blood or protein in urine Patient has hx of HTN - POCT urinalysis dipstick - POCT Microscopic Urinalysis ([facility])  7. Deformity of clavicle Rule out bone neoplasm  - DG Clavicle Right; Future

## 2016-06-17 NOTE — Patient Instructions (Addendum)
   IF you received an x-ray today, you will receive an invoice from [facility]. Please contact [facility] at [phone] with questions or concerns regarding your invoice.   IF you received labwork today, you will receive an invoice from [facility]. Please contact [facility] at [phone] with questions or concerns regarding your invoice.   Our billing staff will not be able to assist you with questions regarding bills from these companies.  You will be contacted with the lab results as soon as they are available. The fastest way to get your results is to activate your My Chart account. Instructions are located on the last page of this paperwork. If you have not heard from us regarding the results in 2 weeks, please contact this office.    Keeping You Healthy  Get These Tests  Blood Pressure- Have your blood pressure checked by your healthcare provider at least once a year.  Normal blood pressure is 120/80.  Weight- Have your body mass index (BMI) calculated to screen for obesity.  BMI is a measure of body fat based on height and weight.  You can calculate your own BMI at www.nhlbisupport.com/bmi/  Cholesterol- Have your cholesterol checked every year.  Diabetes- Have your blood sugar checked every year if you have high blood pressure, high cholesterol, a family history of diabetes or if you are overweight.  Pap Test - Have a pap test every 1 to 5 years if you have been sexually active.  If you are older than 65 and recent pap tests have been normal you may not need additional pap tests.  In addition, if you have had a hysterectomy  for benign disease additional pap tests are not necessary.  Mammogram-Yearly mammograms are essential for early detection of breast cancer  Screening for Colon Cancer- Colonoscopy starting at age 50. Screening may begin sooner depending on your family history and other health conditions.  Follow up colonoscopy  as directed by your Gastroenterologist.  Screening for Osteoporosis- Screening begins at age 65 with bone density scanning, sooner if you are at higher risk for developing Osteoporosis.  Get these medicines  Calcium with Vitamin D- Your body requires 1200-1500 mg of Calcium a day and 800-1000 IU of Vitamin D a day.  You can only absorb 500 mg of Calcium at a time therefore Calcium must be taken in 2 or 3 separate doses throughout the day.  Hormones- Hormone therapy has been associated with increased risk for certain cancers and heart disease.  Talk to your healthcare provider about if you need relief from menopausal symptoms.  Aspirin- Ask your healthcare provider about taking Aspirin to prevent Heart Disease and Stroke.  Get these Immuniztions  Flu shot- Every fall  Pneumonia shot- Once after the age of 65; if you are younger ask your healthcare provider if you need a pneumonia shot.  Tetanus- Every ten years.  Zostavax- Once after the age of 60 to prevent shingles.  Take these steps  Don't smoke- Your healthcare provider can help you quit. For tips on how to quit, ask your healthcare provider or go to www.smokefree.gov or call 1-800 QUIT-NOW.  Be physically active- Exercise 5 days a week for a minimum of 30 minutes.  If you are not already physically active, start slow and gradually work up to 30 minutes of moderate physical activity.  Try walking, dancing, bike riding, swimming, etc.  Eat a healthy diet- Eat a variety of healthy foods such as fruits, vegetables, whole   grains, low fat milk, low fat cheeses, yogurt, lean meats, chicken, fish, eggs, dried beans, tofu, etc.  For more information go to www.thenutritionsource.org  Dental visit- Brush and floss teeth twice daily; visit your dentist twice a year.  Eye exam- Visit your Optometrist or Ophthalmologist yearly.  Drink alcohol in moderation- Limit alcohol intake to one drink or less a day.  Never drink and  drive.  Depression- Your emotional health is as important as your physical health.  If you're feeling down or losing interest in things you normally enjoy, please talk to your healthcare provider.  Seat Belts- can save your life; always wear one  Smoke/Carbon Monoxide detectors- These detectors need to be installed on the appropriate level of your home.  Replace batteries at least once a year.  Violence- If anyone is threatening or hurting you, please tell your healthcare provider.  Living Will/ Health care power of attorney- Discuss with your healthcare provider and family. 

## 2016-06-22 DIAGNOSIS — R7989 Other specified abnormal findings of blood chemistry: Secondary | ICD-10-CM | POA: Insufficient documentation

## 2016-06-23 ENCOUNTER — Encounter: Payer: Self-pay | Admitting: Physician Assistant

## 2017-07-08 ENCOUNTER — Other Ambulatory Visit: Payer: Self-pay | Admitting: Oncology

## 2017-08-07 ENCOUNTER — Ambulatory Visit (INDEPENDENT_AMBULATORY_CARE_PROVIDER_SITE_OTHER): Payer: BLUE CROSS/BLUE SHIELD | Admitting: Family Medicine

## 2017-08-07 DIAGNOSIS — Z23 Encounter for immunization: Secondary | ICD-10-CM | POA: Diagnosis not present

## 2017-12-12 DIAGNOSIS — Z01419 Encounter for gynecological examination (general) (routine) without abnormal findings: Secondary | ICD-10-CM | POA: Diagnosis not present

## 2017-12-12 DIAGNOSIS — N951 Menopausal and female climacteric states: Secondary | ICD-10-CM | POA: Diagnosis not present

## 2017-12-12 DIAGNOSIS — Z800 Family history of malignant neoplasm of digestive organs: Secondary | ICD-10-CM | POA: Diagnosis not present

## 2017-12-12 DIAGNOSIS — Z1231 Encounter for screening mammogram for malignant neoplasm of breast: Secondary | ICD-10-CM | POA: Diagnosis not present

## 2017-12-12 DIAGNOSIS — Z808 Family history of malignant neoplasm of other organs or systems: Secondary | ICD-10-CM | POA: Diagnosis not present

## 2017-12-12 DIAGNOSIS — Z803 Family history of malignant neoplasm of breast: Secondary | ICD-10-CM | POA: Diagnosis not present

## 2017-12-12 DIAGNOSIS — Z6832 Body mass index (BMI) 32.0-32.9, adult: Secondary | ICD-10-CM | POA: Diagnosis not present

## 2018-01-22 DIAGNOSIS — Z809 Family history of malignant neoplasm, unspecified: Secondary | ICD-10-CM | POA: Diagnosis not present

## 2018-01-24 ENCOUNTER — Other Ambulatory Visit: Payer: Self-pay | Admitting: Obstetrics and Gynecology

## 2018-01-24 DIAGNOSIS — Z803 Family history of malignant neoplasm of breast: Secondary | ICD-10-CM

## 2018-02-01 DIAGNOSIS — F4321 Adjustment disorder with depressed mood: Secondary | ICD-10-CM | POA: Diagnosis not present

## 2018-02-12 ENCOUNTER — Ambulatory Visit
Admission: RE | Admit: 2018-02-12 | Discharge: 2018-02-12 | Disposition: A | Discharge status: Home / Self Care | Payer: BLUE CROSS/BLUE SHIELD | Source: Ambulatory Visit | Attending: Obstetrics and Gynecology | Admitting: Obstetrics and Gynecology

## 2018-02-12 DIAGNOSIS — F4323 Adjustment disorder with mixed anxiety and depressed mood: Secondary | ICD-10-CM | POA: Diagnosis not present

## 2018-02-12 DIAGNOSIS — Z803 Family history of malignant neoplasm of breast: Secondary | ICD-10-CM

## 2018-02-12 MED ORDER — GADOBENATE DIMEGLUMINE 529 MG/ML IV SOLN
20.0000 mL | Freq: Once | INTRAVENOUS | Status: AC | PRN
  Administered 2018-02-12: 20 mL via INTRAVENOUS

## 2018-02-15 DIAGNOSIS — F4321 Adjustment disorder with depressed mood: Secondary | ICD-10-CM | POA: Diagnosis not present

## 2018-02-19 ENCOUNTER — Encounter: Payer: Self-pay | Admitting: Family Medicine

## 2018-03-19 DIAGNOSIS — F4323 Adjustment disorder with mixed anxiety and depressed mood: Secondary | ICD-10-CM | POA: Diagnosis not present

## 2018-04-05 DIAGNOSIS — F4321 Adjustment disorder with depressed mood: Secondary | ICD-10-CM | POA: Diagnosis not present

## 2018-06-18 DIAGNOSIS — Z1283 Encounter for screening for malignant neoplasm of skin: Secondary | ICD-10-CM | POA: Diagnosis not present

## 2018-06-18 DIAGNOSIS — D225 Melanocytic nevi of trunk: Secondary | ICD-10-CM | POA: Diagnosis not present

## 2018-08-13 ENCOUNTER — Ambulatory Visit (INDEPENDENT_AMBULATORY_CARE_PROVIDER_SITE_OTHER): Payer: BLUE CROSS/BLUE SHIELD

## 2018-08-13 DIAGNOSIS — Z23 Encounter for immunization: Secondary | ICD-10-CM | POA: Diagnosis not present

## 2018-08-29 ENCOUNTER — Ambulatory Visit: Payer: BLUE CROSS/BLUE SHIELD | Admitting: Emergency Medicine

## 2018-09-06 ENCOUNTER — Encounter: Payer: Self-pay | Admitting: Physician Assistant

## 2018-09-06 ENCOUNTER — Ambulatory Visit (INDEPENDENT_AMBULATORY_CARE_PROVIDER_SITE_OTHER): Payer: BLUE CROSS/BLUE SHIELD | Admitting: Physician Assistant

## 2018-09-06 ENCOUNTER — Other Ambulatory Visit: Payer: Self-pay

## 2018-09-06 VITALS — BP 135/82 | HR 79 | Temp 98.4°F | Resp 16 | Ht 66.0 in | Wt 207.0 lb

## 2018-09-06 DIAGNOSIS — R05 Cough: Secondary | ICD-10-CM | POA: Diagnosis not present

## 2018-09-06 DIAGNOSIS — R058 Other specified cough: Secondary | ICD-10-CM

## 2018-09-06 MED ORDER — PREDNISONE 20 MG PO TABS: 40.0000 mg | ORAL_TABLET | Freq: Every day | ORAL | 0 refills | Status: AC

## 2018-09-06 NOTE — Patient Instructions (Addendum)
Prednisone 40mg  x 5 days. Take with breakfast.     Stay well hydrated. Get lost of rest. Wash your hands often.   -Foods that can help speed recovery: honey, garlic, chicken soup, elderberries, green tea.  -Supplements that can help speed recovery: vitamin C, zinc, elderberry extract, quercetin, ginseng, selenium -Supplement with prebiotics and probiotics:   Advil or ibuprofen for pain. Do not take Aspirin.  Drink enough water and fluids to keep your urine clear or pale yellow.  For sore throat: ? Gargle with 8 oz of salt water ( tsp of salt per 1 qt of water) as often as every 1-2 hours to soothe your throat.  Gargle liquid benadryl.  Cepacol throat lozenges (if you are not at risk for choking).  For sore throat try using a honey-based tea. Use 3 teaspoons of honey with juice squeezed from half lemon. Place shaved pieces of ginger into 1/2-1 cup of water and warm over stove top. Then mix the ingredients and repeat every 4 hours as needed.  Cough Syrup Recipe: Sweet Lemon & Honey Thyme  Ingredients . a handful of fresh thyme sprigs   . 1 pint of water (2 cups)  . 1/2 cup honey (raw is best, but regular will do)  . 1/2 lemon chopped Instructions 1. Place the lemon in the pint jar and cover with the honey. The honey will macerate the lemons and draw out liquids which taste so delicious! 2. Meanwhile, toss the thyme leaves into a saucepan and cover them with the water. 3. Bring the water to a gentle simmer and reduce it to half, about a cup of tea. 4. When the tea is reduced and cooled a bit, strain the sprigs & leaves, add it into the pint jar and stir it well. 5. Give it a shake and use a spoonful as needed. 6. Store your homemade cough syrup in the refrigerator for about a month.  What causes a cough?  In adults, common causes of a cough include: ?An infection of the airways or lungs (such as the common cold) ?Postnasal drip - Postnasal drip is when mucus from the nose drips down  or flows along the back of the throat. Postnasal drip can happen when people have: .A cold .Allergies .A sinus infection - The sinuses are hollow areas in the bones of the face that open into the nose. ?Lung conditions, like asthma and chronic obstructive pulmonary disease (COPD) - Both of these conditions can make it hard to breathe. COPD is usually caused by smoking. ?Acid reflux - Acid reflux is when the acid that is normally in your stomach backs up into your esophagus (the tube that carries food from your mouth to your stomach). ?A side effect from blood pressure medicines called "ACE inhibitors" ?Smoking cigarettes  Is there anything I can do on my own to get rid of my cough?  Yes. To help get rid of your cough, you can: ?Use a humidifier in your bedroom ?Use an over-the-counter cough medicine, or suck on cough drops or hard candy ?Stop smoking, if you smoke ?If you have allergies, avoid the things you are allergic to (like pollen, dust, animals, or mold) If you have acid reflux, your doctor or nurse will tell you which lifestyle changes can help reduce symptoms.

## 2018-09-06 NOTE — Progress Notes (Signed)
Lindsey Boyle  MRN: [id number] DOB: 29-May-1961  PCP: Sherren MochaShaw, Eva N, MD  Subjective:  Pt is a 57 year old female who presents to clinic for cough x 11 days. She had flu-like symptoms for the first few days of her illness.  She endorses a lingering cough. Overall her symptoms are improving. Denies increased fatigue, diaphoresis, shob   Review of Systems  Constitutional: Negative for chills, diaphoresis, fatigue and fever.  HENT: Negative for congestion, postnasal drip, rhinorrhea, sinus pressure, sinus pain and sore throat.   Respiratory: Positive for cough. Negative for shortness of breath and wheezing.   Psychiatric/Behavioral: Negative for sleep disturbance.    Patient Active Problem List   Diagnosis Date Noted  . Elevated serum creatinine 06/22/2016  . Postmenopausal bleeding 10/07/2014    Class: Present on Admission    Current Outpatient Medications on File Prior to Visit  Medication Sig Dispense Refill  . benzonatate (TESSALON) 100 MG capsule Take by mouth 3 (three) times daily as needed for cough.    Marland Kitchen. BIOTIN PO Take by mouth.    . Calcium Carbonate (CALTRATE 600 PO) Take by mouth.    . cetirizine (ZYRTEC) 10 MG tablet Take by mouth daily.    . cholecalciferol (VITAMIN D) 1000 units tablet Take 1,000 Units by mouth daily.    . Fish Oil OIL by Does not apply route.    Marland Kitchen. glucosamine-chondroitin 500-400 MG tablet Take 1 tablet by mouth 3 (three) times daily.    Marland Kitchen. guaiFENesin (MUCINEX) 600 MG 12 hr tablet Take by mouth 2 (two) times daily.    . Multiple Vitamin (MULTIVITAMIN) tablet Take 1 tablet by mouth daily.    . pseudoephedrine (SUDAFED) 30 MG tablet Take 30 mg by mouth every 4 (four) hours as needed for congestion.    Marland Kitchen. aspirin 81 MG tablet Take 81 mg by mouth daily.    Marland Kitchen. aspirin-acetaminophen-caffeine (EXCEDRIN MIGRAINE) 250-250-65 MG per tablet Take 1 tablet by mouth every 6 (six) hours as needed for headache.    . calcium carbonate (TUMS - DOSED IN MG ELEMENTAL  CALCIUM) 500 MG chewable tablet Chew 1 tablet by mouth as needed for indigestion or heartburn.    . vitamin E 100 UNIT capsule Take by mouth daily.     No current facility-administered medications on file prior to visit.     No Known Allergies   Objective:  BP 135/82 (BP Location: Right Arm, Patient Position: Sitting, Cuff Size: Large)   Pulse 79   Temp 98.4 F (36.9 C) (Oral)   Resp 16   Ht 5\' 6"  (1.676 m)   Wt 207 lb (93.9 kg)   SpO2 96%   BMI 33.41 kg/m   Physical Exam Vitals signs reviewed.  Constitutional:      General: She is not in acute distress.    Appearance: Normal appearance. She is not ill-appearing.  HENT:     Right Ear: Tympanic membrane normal.     Left Ear: Tympanic membrane normal.  Pulmonary:     Effort: Pulmonary effort is normal.     Breath sounds: Normal breath sounds. No wheezing, rhonchi or rales.  Neurological:     Mental Status: She is alert.  Psychiatric:        Mood and Affect: Mood normal.        Behavior: Behavior normal.        Thought Content: Thought content normal.     Assessment and Plan :  1. Upper airway cough syndrome -  pt endorses lingering cough after a flu-like illness 11 days ago. Vitals are stable, no adventitious sounds on PE. Plan to treat supportively. Add prednisone. RTC if no improvement.  - predniSONE (DELTASONE) 20 MG tablet; Take 2 tablets (40 mg total) by mouth daily with breakfast for 5 days.  Dispense: 10 tablet; Refill: 0   Marco Collie, PA-C  [facility] 09/06/2018 4:14 PM  Please note: Portions of this report may have been transcribed using dragon voice recognition software. Every effort was made to ensure accuracy; however, inadvertent computerized transcription errors may be present.

## 2019-02-07 DIAGNOSIS — Z1231 Encounter for screening mammogram for malignant neoplasm of breast: Secondary | ICD-10-CM | POA: Diagnosis not present

## 2019-02-07 DIAGNOSIS — Z01419 Encounter for gynecological examination (general) (routine) without abnormal findings: Secondary | ICD-10-CM | POA: Diagnosis not present

## 2019-02-07 DIAGNOSIS — Z6831 Body mass index (BMI) 31.0-31.9, adult: Secondary | ICD-10-CM | POA: Diagnosis not present

## 2019-02-13 ENCOUNTER — Other Ambulatory Visit: Payer: Self-pay | Admitting: Obstetrics and Gynecology

## 2019-02-13 DIAGNOSIS — Z9189 Other specified personal risk factors, not elsewhere classified: Secondary | ICD-10-CM

## 2019-02-13 DIAGNOSIS — Z803 Family history of malignant neoplasm of breast: Secondary | ICD-10-CM

## 2019-03-05 ENCOUNTER — Other Ambulatory Visit: Payer: Self-pay | Admitting: Obstetrics and Gynecology

## 2019-03-08 ENCOUNTER — Other Ambulatory Visit: Payer: Self-pay | Admitting: Obstetrics and Gynecology

## 2019-03-12 ENCOUNTER — Ambulatory Visit
Admission: RE | Admit: 2019-03-12 | Discharge: 2019-03-12 | Disposition: A | Discharge status: Home / Self Care | Payer: BLUE CROSS/BLUE SHIELD | Source: Ambulatory Visit | Attending: Obstetrics and Gynecology | Admitting: Obstetrics and Gynecology

## 2019-03-12 DIAGNOSIS — Z803 Family history of malignant neoplasm of breast: Secondary | ICD-10-CM

## 2019-03-12 DIAGNOSIS — Z1239 Encounter for other screening for malignant neoplasm of breast: Secondary | ICD-10-CM | POA: Diagnosis not present

## 2019-03-12 DIAGNOSIS — Z9189 Other specified personal risk factors, not elsewhere classified: Secondary | ICD-10-CM

## 2019-03-12 MED ORDER — GADOBUTROL 1 MMOL/ML IV SOLN
9.0000 mL | Freq: Once | INTRAVENOUS | Status: AC | PRN
  Administered 2019-03-12: 9 mL via INTRAVENOUS

## 2019-06-13 DIAGNOSIS — Z0000 Encounter for general adult medical examination without abnormal findings: Secondary | ICD-10-CM | POA: Diagnosis not present

## 2019-06-19 DIAGNOSIS — E7849 Other hyperlipidemia: Secondary | ICD-10-CM | POA: Diagnosis not present

## 2019-06-19 DIAGNOSIS — Z23 Encounter for immunization: Secondary | ICD-10-CM | POA: Diagnosis not present

## 2019-06-19 DIAGNOSIS — Z0000 Encounter for general adult medical examination without abnormal findings: Secondary | ICD-10-CM | POA: Diagnosis not present

## 2019-06-19 DIAGNOSIS — R7989 Other specified abnormal findings of blood chemistry: Secondary | ICD-10-CM | POA: Diagnosis not present

## 2019-07-05 ENCOUNTER — Other Ambulatory Visit: Payer: Self-pay

## 2019-07-05 DIAGNOSIS — Z20828 Contact with and (suspected) exposure to other viral communicable diseases: Secondary | ICD-10-CM | POA: Diagnosis not present

## 2019-07-05 DIAGNOSIS — Z20822 Contact with and (suspected) exposure to covid-19: Secondary | ICD-10-CM

## 2019-07-07 LAB — NOVEL CORONAVIRUS, NAA: SARS-CoV-2, NAA: NOT DETECTED

## 2019-07-15 DIAGNOSIS — L821 Other seborrheic keratosis: Secondary | ICD-10-CM | POA: Diagnosis not present

## 2019-07-15 DIAGNOSIS — Z1283 Encounter for screening for malignant neoplasm of skin: Secondary | ICD-10-CM | POA: Diagnosis not present

## 2019-07-17 DIAGNOSIS — R7989 Other specified abnormal findings of blood chemistry: Secondary | ICD-10-CM | POA: Diagnosis not present

## 2019-08-13 ENCOUNTER — Other Ambulatory Visit: Payer: Self-pay

## 2019-08-13 DIAGNOSIS — Z20822 Contact with and (suspected) exposure to covid-19: Secondary | ICD-10-CM

## 2019-08-15 LAB — NOVEL CORONAVIRUS, NAA: SARS-CoV-2, NAA: NOT DETECTED

## 2020-03-16 DIAGNOSIS — Z1231 Encounter for screening mammogram for malignant neoplasm of breast: Secondary | ICD-10-CM | POA: Diagnosis not present

## 2020-03-16 DIAGNOSIS — Z13820 Encounter for screening for osteoporosis: Secondary | ICD-10-CM | POA: Diagnosis not present

## 2020-04-27 DIAGNOSIS — M8580 Other specified disorders of bone density and structure, unspecified site: Secondary | ICD-10-CM | POA: Diagnosis not present

## 2020-07-09 DIAGNOSIS — Z01419 Encounter for gynecological examination (general) (routine) without abnormal findings: Secondary | ICD-10-CM | POA: Diagnosis not present

## 2020-07-09 DIAGNOSIS — Z6832 Body mass index (BMI) 32.0-32.9, adult: Secondary | ICD-10-CM | POA: Diagnosis not present

## 2020-07-10 ENCOUNTER — Other Ambulatory Visit: Payer: Self-pay | Admitting: Obstetrics and Gynecology

## 2020-07-10 DIAGNOSIS — Z9189 Other specified personal risk factors, not elsewhere classified: Secondary | ICD-10-CM

## 2020-08-07 ENCOUNTER — Other Ambulatory Visit: Payer: Self-pay

## 2020-08-07 ENCOUNTER — Ambulatory Visit
Admission: RE | Admit: 2020-08-07 | Discharge: 2020-08-07 | Disposition: A | Discharge status: Home / Self Care | Payer: BC Managed Care – PPO | Source: Ambulatory Visit | Attending: Obstetrics and Gynecology | Admitting: Obstetrics and Gynecology

## 2020-08-07 DIAGNOSIS — N6489 Other specified disorders of breast: Secondary | ICD-10-CM | POA: Diagnosis not present

## 2020-08-07 DIAGNOSIS — Z9189 Other specified personal risk factors, not elsewhere classified: Secondary | ICD-10-CM

## 2020-08-07 MED ORDER — GADOBUTROL 1 MMOL/ML IV SOLN
8.0000 mL | Freq: Once | INTRAVENOUS | Status: AC | PRN
  Administered 2020-08-07: 8 mL via INTRAVENOUS

## 2021-07-15 ENCOUNTER — Other Ambulatory Visit: Payer: Self-pay | Admitting: Obstetrics and Gynecology

## 2021-07-15 DIAGNOSIS — Z803 Family history of malignant neoplasm of breast: Secondary | ICD-10-CM

## 2021-07-15 DIAGNOSIS — Z800 Family history of malignant neoplasm of digestive organs: Secondary | ICD-10-CM

## 2021-08-03 ENCOUNTER — Ambulatory Visit
Admission: RE | Admit: 2021-08-03 | Discharge: 2021-08-03 | Disposition: A | Discharge status: Home / Self Care | Payer: BC Managed Care – PPO | Source: Ambulatory Visit | Attending: Obstetrics and Gynecology | Admitting: Obstetrics and Gynecology

## 2021-08-03 ENCOUNTER — Other Ambulatory Visit: Payer: Self-pay

## 2021-08-03 DIAGNOSIS — Z800 Family history of malignant neoplasm of digestive organs: Secondary | ICD-10-CM

## 2021-08-03 MED ORDER — GADOBENATE DIMEGLUMINE 529 MG/ML IV SOLN
19.0000 mL | Freq: Once | INTRAVENOUS | Status: AC | PRN
  Administered 2021-08-03: 19 mL via INTRAVENOUS

## 2021-09-22 ENCOUNTER — Encounter: Payer: BC Managed Care – PPO | Attending: Obstetrics and Gynecology | Admitting: Skilled Nursing Facility1

## 2021-09-22 ENCOUNTER — Other Ambulatory Visit: Payer: Self-pay

## 2021-09-22 DIAGNOSIS — E7800 Pure hypercholesterolemia, unspecified: Secondary | ICD-10-CM | POA: Diagnosis present

## 2021-09-22 NOTE — Progress Notes (Signed)
Medical Nutrition Therapy  Appointment Start time:  2:56  Appointment End time:  3:56  Primary concerns today: Lifestyle change to improve her hyperlipidemia   Referral diagnosis: Hyperlipidemia  Preferred learning style: visual (auditory, visual, hands on, no preference indicated) Learning readiness: pre contemplative (not ready, contemplating, ready, change in progress)   NUTRITION ASSESSMENT   Anthropometrics  Weight: pt declined  Height: BMI:   Clinical Medical Hx: GERD Medications: See list Labs: LDL 163, cholesterol: 257 triglycerides: 230 Notable Signs/Symptoms: N/A  Lifestyle & Dietary Hx    Pt states concern with hyperlipidemia and wants to know what foods are playing a role in this.  Pt states she has lost weight but has gained most of it back.   Pt states she feels like she stress eats. With weight watchers it was about the mindfulness.   Pt states she fell out of the routine of going to the gym.   Pt states she is eating out once a week.    After Dietitian offered detailed education on balanced meals and structured eating that would both help reduce her lipid levels as well as assist her in future overall health. Pt requested a meal plan similar to one given by a diet facility which would include a limited list of foods the pt could choose from each day typically leading to food fatigue and abandoning the diet altogether eventually leading to hyperlipidemia and other disease states linked to overall unhealthy diets/lifestyle.  Pt states this (referring to the education offered) is a lot more common sense then what she expected. Dietitian advised pt the education offered inclusive of: macronutrient distribution, food type categories, complex carbohydrates verses simple carbohydrates, saturated fat verses unsaturated fat, food options within each category, and specific food types/grouping that will lead to reducing lab values of cholesterol and triglycerides.  Pt  inquired what her lab values were: Dietitian advised pt she did not have those numbers she only received the lab orders which had not been filled at that time. Per pts request dietitian had front office request lab values from her doctors office which did not come in until after the appointment was over.   Estimated daily fluid intake: 50 oz Supplements: Biotin, fish oil, multi, calcium , vitamin D,  Sleep: 6+ hours Stress / self-care:  Current average weekly physical activity: ADLs  24-Hr Dietary Recall First Meal: coffee, greek yogurt with granola Snack: pretzels + cheese crackers  Second Meal: [place] sandwich with carrots or grapes or peppers  Snack: chips and salsa or nuts  Third Meal: grilled chicken or salmon + veggies  Snack: ice cream  Beverages: water, coffee + stevia + creamer, 9 oz wine 3x a week   Estimated Energy Needs Calories: 1600   NUTRITION DIAGNOSIS  NB-1.1 Food and nutrition-related knowledge deficit As related to previous diet ideology .  As evidenced by hyperlipidemia diagnosis and labs .   NUTRITION INTERVENTION  Nutrition education (E-1) on the following topics:  Saturated fat content in foods  Building a balanced meal  Complex carbohydrates  Food categories, how this impacts cardiovascular health, and how often to eat these foods throughout the week Specific serving sizes of each food group and how often throughout the day and week to consume them  Education conducted with the use of rubber food models, teach back, and colored handouts.   Handouts Provided Include  Meal Ideas sheet Detailed Myplate marked specifically for pt  Learning Style & Readiness for Change Teaching method utilized: Visual &  Auditory  Demonstrated degree of understanding via: Teach Back

## 2021-10-28 ENCOUNTER — Other Ambulatory Visit: Payer: BC Managed Care – PPO

## 2021-11-12 ENCOUNTER — Other Ambulatory Visit: Payer: Self-pay

## 2021-11-12 ENCOUNTER — Ambulatory Visit
Admission: RE | Admit: 2021-11-12 | Discharge: 2021-11-12 | Disposition: A | Discharge status: Home / Self Care | Payer: BC Managed Care – PPO | Source: Ambulatory Visit | Attending: Obstetrics and Gynecology | Admitting: Obstetrics and Gynecology

## 2021-11-12 DIAGNOSIS — Z803 Family history of malignant neoplasm of breast: Secondary | ICD-10-CM

## 2021-11-12 MED ORDER — GADOBUTROL 1 MMOL/ML IV SOLN
9.0000 mL | Freq: Once | INTRAVENOUS | Status: AC | PRN
  Administered 2021-11-12: 9 mL via INTRAVENOUS

## 2022-04-25 IMAGING — MR MR BREAST BILAT WO/W CM
8 of 11 series · 33 of 48 positions shown · IV contrast (9ml gadavist)
Comparison: Previous exams, including prior Breast MRI 08/07/2020
and 03/12/2019.

CLINICAL DATA: 61-year-old with family history of breast cancer and
elevated estimated lifetime risk of breast cancer of greater than
20%. Supplemental high risk screening.

EXAM:
BILATERAL BREAST MRI WITH AND WITHOUT CONTRAST
TECHNIQUE: Multiplanar, multisequence MR images of both breasts were obtained
prior to and following the intravenous administration of 9 ml of
Gadavist.

[Series 2: t2_tirm_tra ipat (a-p) · axial · 3.0mm · 0.80mm/px · 1 of 60 slices shown]
[im 1/60]
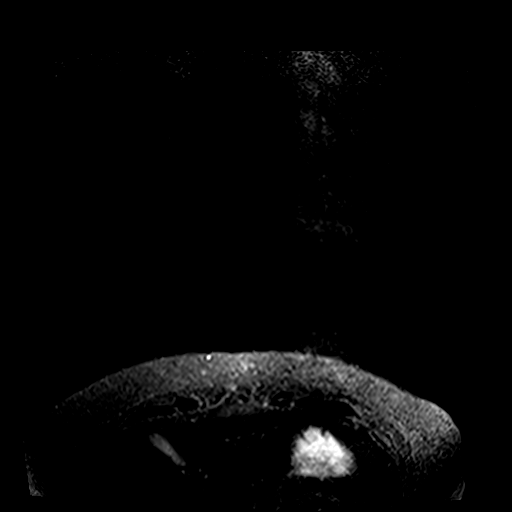

[Series 3: fl3d pre-cm no · axial · non-contrast · 1.2mm · 1.07mm/px · z∈[-93,+98]mm · 5 of 160 slices shown]
[im 1/160]
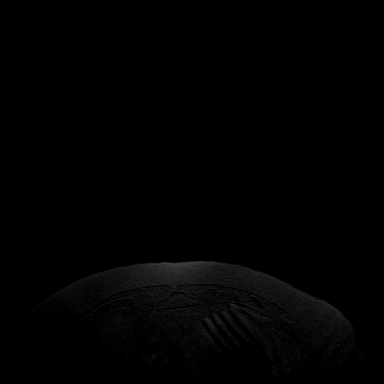
[im 40/160]
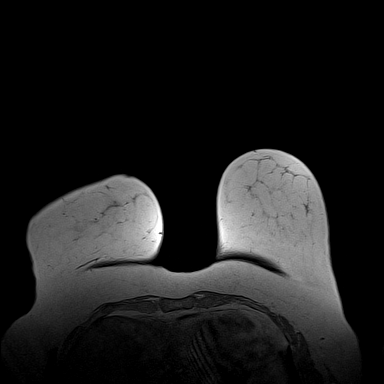
[im 80/160]
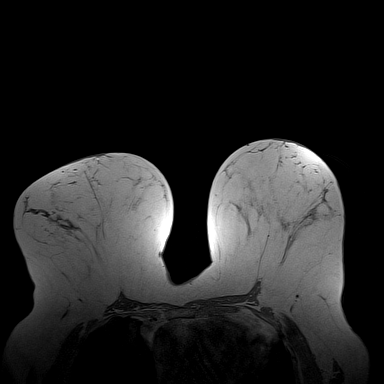
[im 120/160]
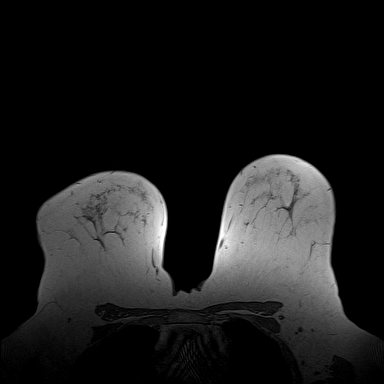
[im 160/160]
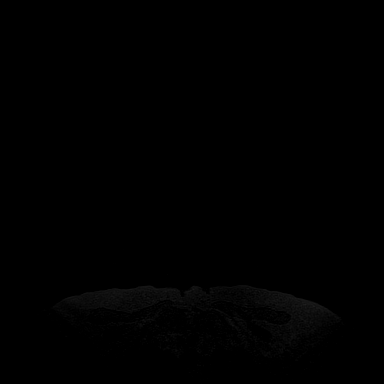

[Series 4: fl3d pre-cm · axial · non-contrast · 1.2mm · 1.07mm/px · z∈[-93,+98]mm · 5 of 160 slices shown]
[im 1/160]
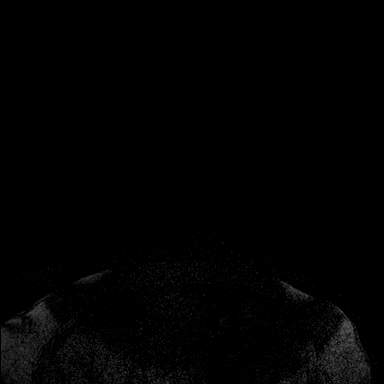
[im 40/160]
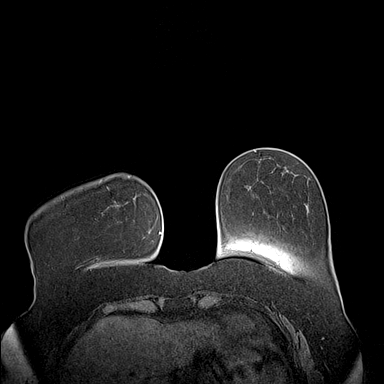
[im 80/160]
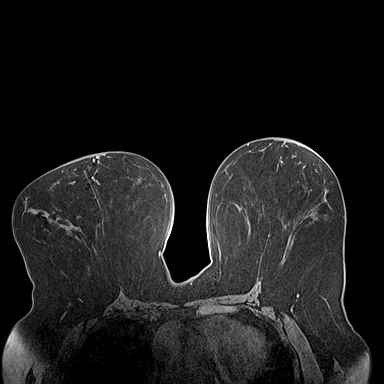
[im 120/160]
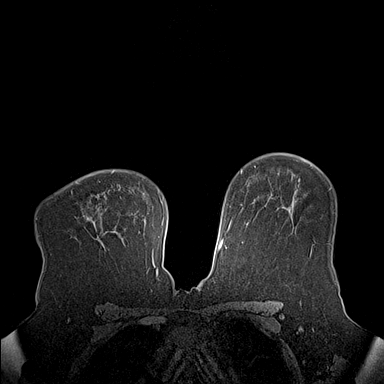
[im 160/160]
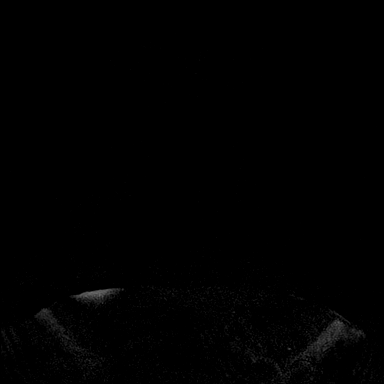

[Series 5: fl3d post-cm 20 · axial · 1.2mm · 1.07mm/px · z∈[-93,+98]mm · 5 of 160 slices shown (1 of 3)]
[im 1/160]
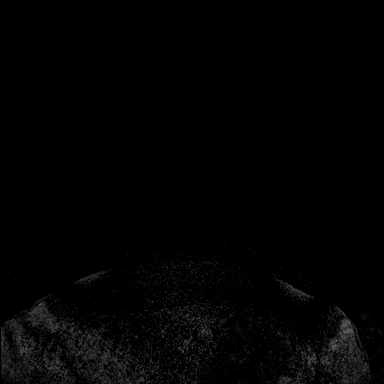
[im 40/160]
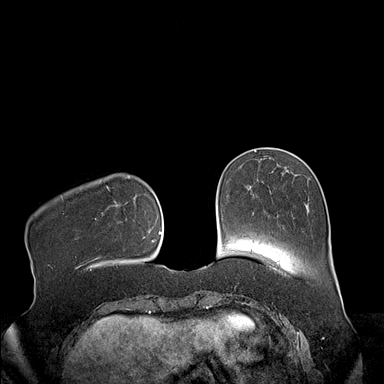
[im 80/160]
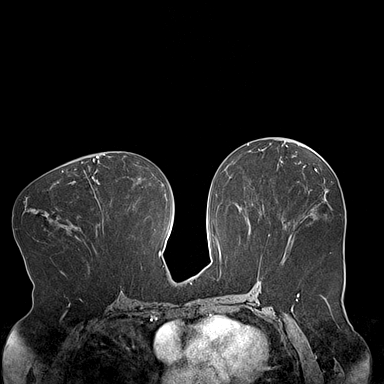
[im 120/160]
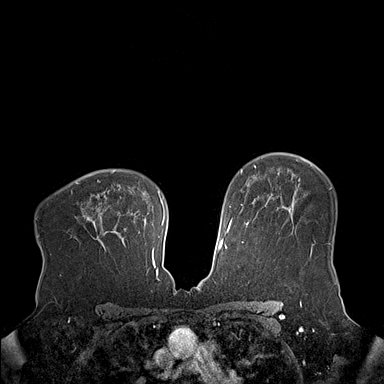
[im 160/160]
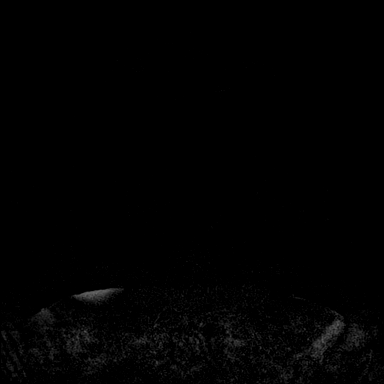

[Series 6: fl3d post-cm 20 · axial · 1.2mm · 1.07mm/px · z∈[-93,+98]mm · 6 of 160 slices shown (2 of 3)]
[im 1/160]
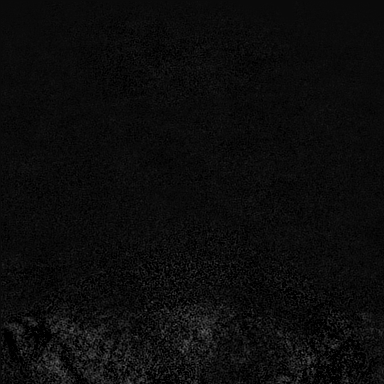
[im 32/160]
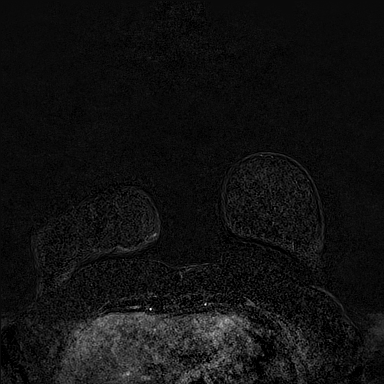
[im 64/160]
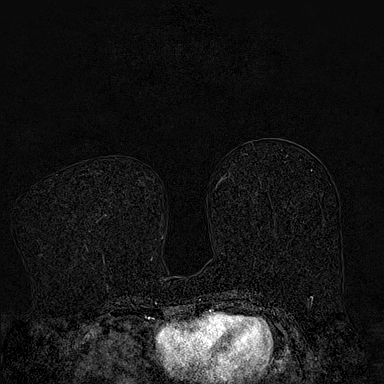
[im 96/160]
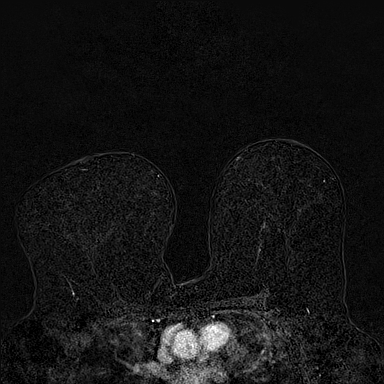
[im 128/160]
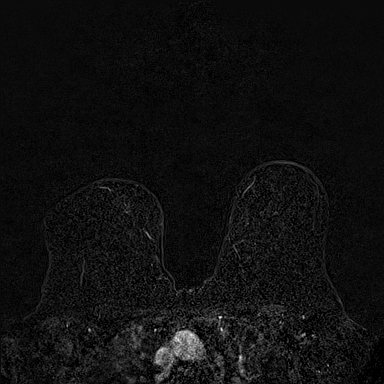
[im 160/160]
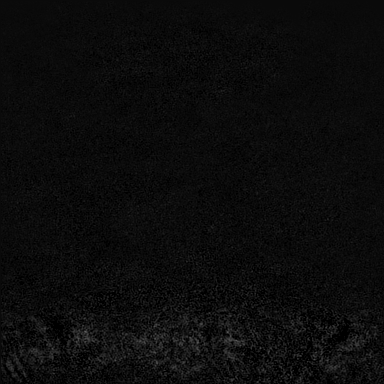

[Series 7: fl3d post-cm 20 · axial · 192.0mm · 1.07mm/px · 1 of 1 slices shown (3 of 3)]
[im 1/1]
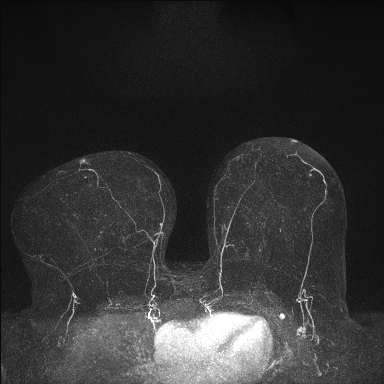

[Series 8: fl3d post-cm 3 · axial · 1.2mm · 1.07mm/px · z∈[-93,+98]mm · 6 of 159 slices shown (1 of 2)]
[im 1/159]
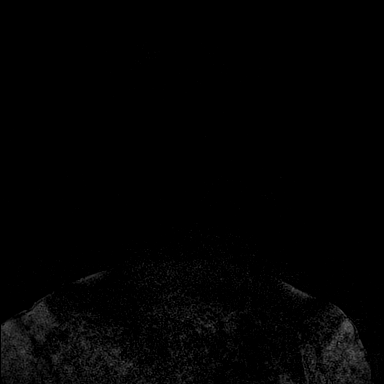
[im 32/159]
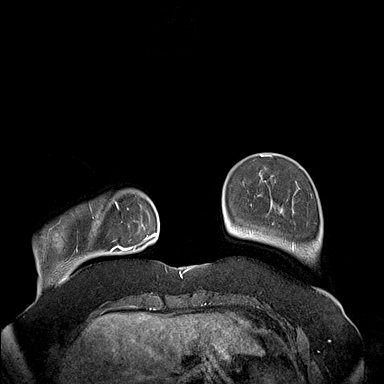
[im 64/159]
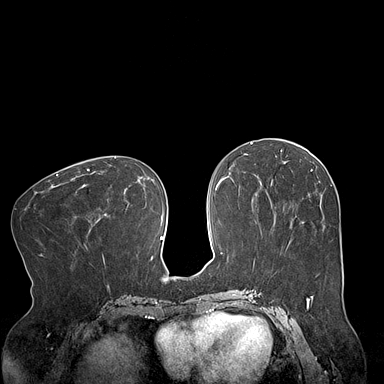
[im 95/159]
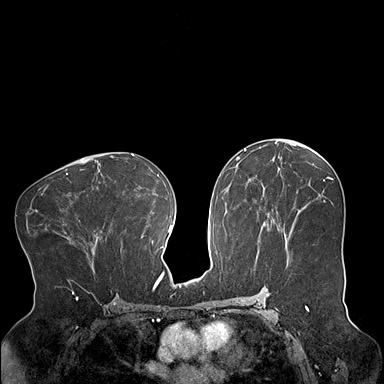
[im 127/159]
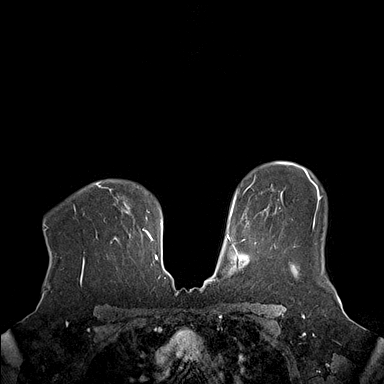
[im 159/159]
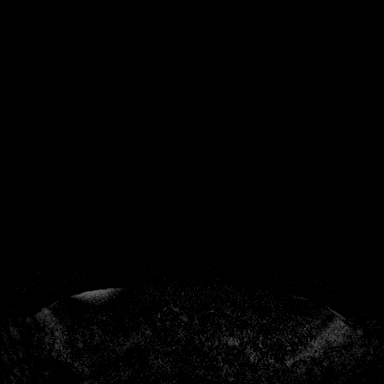

[Series 9: fl3d post-cm 3 · axial · 1.2mm · 1.07mm/px · z∈[-93,+21]mm · 4 of 160 slices shown (2 of 2)]
[im 1/160]
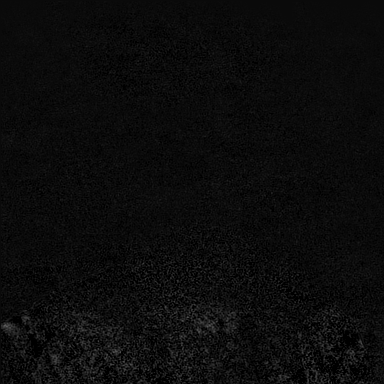
[im 32/160]
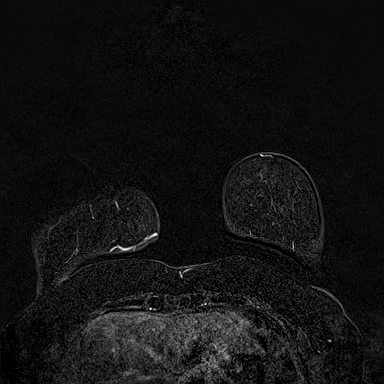
[im 64/160]
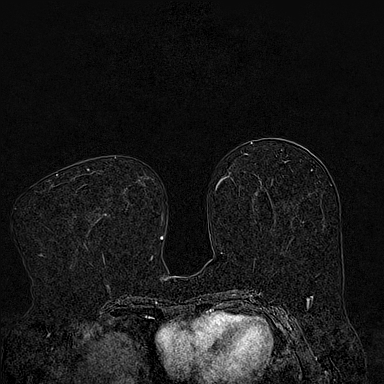
[im 96/160]
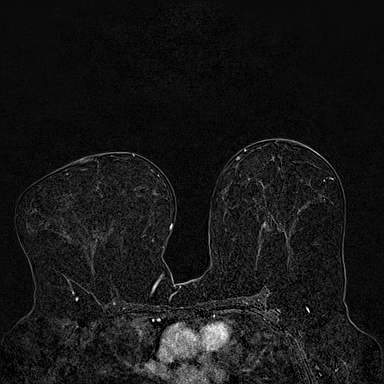

[33 of 48 positions shown; findings below may reference images not displayed]

Three-dimensional MR images were rendered by post-processing of the
original MR data on an independent workstation. The
three-dimensional MR images were interpreted, and findings are
reported in the following complete MRI report for this study. Three
dimensional images were evaluated at the independent interpreting
workstation using the DynaCAD thin client.
FINDINGS: Breast composition: b. Scattered fibroglandular tissue.

Background parenchymal enhancement: Mild.

Right breast: No suspicious mass or abnormal enhancement.

Left breast: No suspicious mass or abnormal enhancement.

Lymph nodes: No pathologic lymphadenopathy.

Ancillary findings:  None.
IMPRESSION: No MRI evidence of malignancy involving either breast.

RECOMMENDATION:
1. Annual BILATERAL screening mammography which is due in April 2022.
2. Consider annual supplemental high risk screening MRI as long as
the patient's estimated lifetime risk of breast cancer exceeds 20%.

BI-RADS CATEGORY  1: Negative.

## 2022-07-15 ENCOUNTER — Other Ambulatory Visit: Payer: Self-pay | Admitting: Obstetrics and Gynecology

## 2022-07-15 DIAGNOSIS — Z803 Family history of malignant neoplasm of breast: Secondary | ICD-10-CM

## 2022-07-15 DIAGNOSIS — Z800 Family history of malignant neoplasm of digestive organs: Secondary | ICD-10-CM

## 2022-08-22 ENCOUNTER — Ambulatory Visit
Admission: RE | Admit: 2022-08-22 | Discharge: 2022-08-22 | Disposition: A | Discharge status: Home / Self Care | Payer: BC Managed Care – PPO | Source: Ambulatory Visit | Attending: Obstetrics and Gynecology | Admitting: Obstetrics and Gynecology

## 2022-08-22 DIAGNOSIS — Z800 Family history of malignant neoplasm of digestive organs: Secondary | ICD-10-CM

## 2022-08-22 MED ORDER — GADOPICLENOL 0.5 MMOL/ML IV SOLN
9.0000 mL | Freq: Once | INTRAVENOUS | Status: AC | PRN
  Administered 2022-08-22: 9 mL via INTRAVENOUS

## 2022-11-07 ENCOUNTER — Other Ambulatory Visit: Payer: BC Managed Care – PPO

## 2022-11-21 ENCOUNTER — Ambulatory Visit
Admission: RE | Admit: 2022-11-21 | Discharge: 2022-11-21 | Disposition: A | Discharge status: Home / Self Care | Payer: BC Managed Care – PPO | Source: Ambulatory Visit | Attending: Obstetrics and Gynecology | Admitting: Obstetrics and Gynecology

## 2022-11-21 DIAGNOSIS — Z803 Family history of malignant neoplasm of breast: Secondary | ICD-10-CM

## 2022-11-21 MED ORDER — GADOPICLENOL 0.5 MMOL/ML IV SOLN
10.0000 mL | Freq: Once | INTRAVENOUS | Status: AC | PRN
  Administered 2022-11-21: 10 mL via INTRAVENOUS

## 2023-08-01 ENCOUNTER — Other Ambulatory Visit: Payer: Self-pay | Admitting: Obstetrics and Gynecology

## 2023-08-01 DIAGNOSIS — Z800 Family history of malignant neoplasm of digestive organs: Secondary | ICD-10-CM

## 2023-09-11 ENCOUNTER — Other Ambulatory Visit: Payer: BC Managed Care – PPO

## 2023-09-11 ENCOUNTER — Ambulatory Visit
Admission: RE | Admit: 2023-09-11 | Discharge: 2023-09-11 | Disposition: A | Discharge status: Home / Self Care | Payer: BC Managed Care – PPO | Source: Ambulatory Visit | Attending: Obstetrics and Gynecology | Admitting: Obstetrics and Gynecology

## 2023-09-11 DIAGNOSIS — Z800 Family history of malignant neoplasm of digestive organs: Secondary | ICD-10-CM

## 2023-09-11 MED ORDER — GADOPICLENOL 0.5 MMOL/ML IV SOLN
10.0000 mL | Freq: Once | INTRAVENOUS | Status: AC | PRN
  Administered 2023-09-11: 10 mL via INTRAVENOUS

## 2024-08-01 ENCOUNTER — Other Ambulatory Visit: Payer: Self-pay | Admitting: Obstetrics and Gynecology

## 2024-08-01 DIAGNOSIS — Z803 Family history of malignant neoplasm of breast: Secondary | ICD-10-CM

## 2024-08-01 DIAGNOSIS — Z800 Family history of malignant neoplasm of digestive organs: Secondary | ICD-10-CM

## 2024-08-26 ENCOUNTER — Ambulatory Visit
Admission: RE | Admit: 2024-08-26 | Discharge: 2024-08-26 | Disposition: A | Source: Ambulatory Visit | Attending: Obstetrics and Gynecology | Admitting: Obstetrics and Gynecology

## 2024-08-26 DIAGNOSIS — Z800 Family history of malignant neoplasm of digestive organs: Secondary | ICD-10-CM

## 2024-08-26 MED ORDER — GADOPICLENOL 0.5 MMOL/ML IV SOLN
10.0000 mL | Freq: Once | INTRAVENOUS | Status: AC | PRN
  Administered 2024-08-26: 10 mL via INTRAVENOUS

## 2024-12-16 ENCOUNTER — Other Ambulatory Visit
